# Patient Record
Sex: Female | Born: 1985 | Race: White | Hispanic: Yes | Marital: Single | Smoking: Never smoker
Health system: Southern US, Community
[De-identification: ages and names within clinical notes are randomized; demographics above are authoritative.]

## PROBLEM LIST (undated history)

## (undated) DIAGNOSIS — Z789 Other specified health status: Secondary | ICD-10-CM

## (undated) HISTORY — PX: NO PAST SURGERIES: SHX2092

---

## 2014-10-26 NOTE — L&D Delivery Note (Signed)
Delivery Note At 2:47 AM a viable female was delivered via Vaginal, Spontaneous Delivery (Presentation: ; Occiput Posterior).  APGAR: 7-8 , ; weight: 1800 grams .   Placenta status: Intact, Spontaneous.  Cord: 3 vessels with the following complications: None.  Cord pH: none  Anesthesia: None  Episiotomy: None Lacerations: None Suture Repair: none Est. Blood Loss (mL): 500  Mom to postpartum.  Baby to NICU for prematurity.  [name] A 09/08/2015, 3:26 AM

## 2015-04-08 ENCOUNTER — Telehealth: Payer: Self-pay | Admitting: Obstetrics

## 2015-04-08 NOTE — Telephone Encounter (Signed)
[id number] - scheduled patient's NOB appt for [phone] with Orvilla Cornwall. brm

## 2015-04-18 ENCOUNTER — Encounter: Payer: Medicaid Other | Admitting: Certified Nurse Midwife

## 2015-04-24 ENCOUNTER — Encounter: Payer: Self-pay | Admitting: Certified Nurse Midwife

## 2015-04-24 ENCOUNTER — Ambulatory Visit (INDEPENDENT_AMBULATORY_CARE_PROVIDER_SITE_OTHER): Payer: Medicaid Other | Admitting: Certified Nurse Midwife

## 2015-04-24 VITALS — BP 110/66 | HR 86 | Temp 98.3°F | Wt 133.0 lb

## 2015-04-24 DIAGNOSIS — Z3492 Encounter for supervision of normal pregnancy, unspecified, second trimester: Secondary | ICD-10-CM | POA: Insufficient documentation

## 2015-04-24 DIAGNOSIS — Z3482 Encounter for supervision of other normal pregnancy, second trimester: Secondary | ICD-10-CM | POA: Diagnosis not present

## 2015-04-24 DIAGNOSIS — Z36 Encounter for antenatal screening of mother: Secondary | ICD-10-CM

## 2015-04-24 DIAGNOSIS — Z3687 Encounter for antenatal screening for uncertain dates: Secondary | ICD-10-CM

## 2015-04-24 DIAGNOSIS — O219 Vomiting of pregnancy, unspecified: Secondary | ICD-10-CM

## 2015-04-24 DIAGNOSIS — O2690 Pregnancy related conditions, unspecified, unspecified trimester: Secondary | ICD-10-CM | POA: Diagnosis not present

## 2015-04-24 DIAGNOSIS — K219 Gastro-esophageal reflux disease without esophagitis: Secondary | ICD-10-CM

## 2015-04-24 LAB — POCT URINE PREGNANCY: Preg Test, Ur: POSITIVE — AB

## 2015-04-24 LAB — TSH: TSH: 0.995 u[IU]/mL (ref 0.350–4.500)

## 2015-04-24 MED ORDER — RANITIDINE HCL 150 MG PO TABS: 150.0000 mg | ORAL_TABLET | Freq: Two times a day (BID) | ORAL | Status: AC

## 2015-04-24 MED ORDER — ONDANSETRON HCL 4 MG PO TABS: 4.0000 mg | ORAL_TABLET | Freq: Three times a day (TID) | ORAL | Status: DC | PRN

## 2015-04-24 NOTE — Progress Notes (Signed)
Subjective:    Candice Reed is being seen today for her first obstetrical visit.  This is a planned pregnancy. She is at 7976w0d gestation. Her obstetrical history is significant for no risk factors. Relationship with FOB: significant other, living together. Patient does intend to breast feed. Pregnancy history fully reviewed.  Spanish interpreter present for exam.    The information documented in the HPI was reviewed and verified.  Menstrual History: OB History    Gravida Para Term Preterm AB TAB SAB Ectopic Multiple Living   2               Menarche age: 89  Patient's last menstrual period was 12/26/2014.    History reviewed. No pertinent past medical history.  History reviewed. No pertinent past surgical history.   (Not in a hospital admission) No Known Allergies  History  Substance Use Topics  . Smoking status: Not on file  . Smokeless tobacco: Not on file  . Alcohol Use: Not on file   Denies any alcohol, tobacco or illicit drug use.    Family History  Problem Relation Age of Onset  . Epilepsy Sister      Review of Systems Constitutional: negative for weight loss Gastrointestinal: + for nausea & vomiting Genitourinary:negative for genital lesions and vaginal discharge and dysuria Musculoskeletal:negative for back pain Behavioral/Psych: negative for abusive relationship, depression, illegal drug usage and tobacco use    Objective:    LMP 12/26/2014 General Appearance:    Alert, cooperative, no distress, appears stated age  Head:    Normocephalic, without obvious abnormality, atraumatic  Eyes:    PERRL, conjunctiva/corneas clear, EOM's intact, fundi    benign, both eyes  Ears:    Normal TM's and external ear canals, both ears  Nose:   Nares normal, septum midline, mucosa normal, no drainage    or sinus tenderness  Throat:   Lips, mucosa, and tongue normal; teeth and gums normal  Neck:   Supple, symmetrical, trachea midline, no adenopathy;    thyroid:  no  enlargement/tenderness/nodules; no carotid   bruit or JVD  Back:     Symmetric, no curvature, ROM normal, no CVA tenderness  Lungs:     Clear to auscultation bilaterally, respirations unlabored  Chest Wall:    No tenderness or deformity   Heart:    Regular rate and rhythm, S1 and S2 normal, no murmur, rub   or gallop  Breast Exam:    No tenderness, masses, or nipple abnormality  Abdomen:     Soft, non-tender, bowel sounds active all four quadrants,    no masses, no organomegaly  Genitalia:    Normal female without lesion, discharge or tenderness  Extremities:   Extremities normal, atraumatic, no cyanosis or edema  Pulses:   2+ and symmetric all extremities  Skin:   Skin color, texture, turgor normal, no rashes or lesions  Lymph nodes:   Cervical, supraclavicular, and axillary nodes normal  Neurologic:   CNII-XII intact, normal strength, sensation and reflexes    throughout     Cervix: long, thick, closed.      Fundus: about 13-[redacted] weeks pregnant.     Lab Review Urine pregnancy test Labs reviewed yes Radiologic studies reviewed no Assessment:    Pregnancy at 5376w0d weeks    Plan:      Prenatal vitamins.  Counseling provided regarding continued use of seat belts, cessation of alcohol consumption, smoking or use of illicit drugs; infection precautions i.e., influenza/TDAP immunizations, toxoplasmosis,CMV, parvovirus, listeria and varicella;  workplace safety, exercise during pregnancy; routine dental care, safe medications, sexual activity, hot tubs, saunas, pools, travel, caffeine use, fish and methlymercury, potential toxins, hair treatments, varicose veins Weight gain recommendations per IOM guidelines reviewed: underweight/BMI< 18.5--> gain 28 - 40 lbs; normal weight/BMI 18.5 - 24.9--> gain 25 - 35 lbs; overweight/BMI 25 - 29.9--> gain 15 - 25 lbs; obese/BMI >30->gain  11 - 20 lbs Problem list reviewed and updated. FIRST/CF mutation testing/NIPT/QUAD SCREEN/fragile X/Ashkenazi Jewish  population testing/Spinal muscular atrophy discussed: requested. Role of ultrasound in pregnancy discussed; fetal survey: requested. Amniocentesis discussed: not indicated. VBAC calculator score: VBAC consent form provided Meds ordered this encounter  Medications  . ranitidine (ZANTAC) 150 MG tablet    Sig: Take 1 tablet (150 mg total) by mouth 2 (two) times daily.    Dispense:  30 tablet    Refill:  3  . ondansetron (ZOFRAN) 4 MG tablet    Sig: Take 1 tablet (4 mg total) by mouth every 8 (eight) hours as needed for nausea or vomiting.    Dispense:  30 tablet    Refill:  4   Orders Placed This Encounter  Procedures  . Culture, OB Urine  . SureSwab, Vaginosis/Vaginitis Plus  . US OB Transvaginal    Standing Status: Future     Number of Occurrences:      Standing Expiration Date: 06/23/2016    Order Specific Question:  Reason for Exam (SYMPTOM  OR DIAGNOSIS REQUIRED)    Answer:  dating    Order Specific Question:  Preferred imaging location?    Answer:  [facility]  . Obstetric panel  . HIV antibody  . Hemoglobinopathy evaluation  . Varicella zoster antibody, IgG  . Vit D  25 hydroxy (rtn osteoporosis monitoring)  . TSH  . AFP, Quad Screen    Order Specific Question:  Repeat Sample    Answer:  No    Order Specific Question:  Maternal Race    Answer:  hispanic    Order Specific Question:  EDD    Answer:  10/02/2015    Order Specific Question:  Pregnancy Donor Egg (Y/N)    Answer:  No    Order Specific Question:  Gest Age at U/S (Wk.Dy)    Answer:  17.0    Order Specific Question:  Number of Fetuses    Answer:  1    Order Specific Question:  Hx of OSB/NTD?    Answer:  No    Order Specific Question:  Maternal IDDM (insulin-dependent diabetes mellitus)    Answer:  No  . POCT urine pregnancy  . POCT urinalysis dipstick    Follow up in 4 weeks. 50% of 30 min visit spent on counseling and coordination of care.

## 2015-04-25 LAB — CULTURE, OB URINE: Colony Count: 40000

## 2015-04-25 LAB — PAP IG W/ RFLX HPV ASCU

## 2015-04-25 LAB — VITAMIN D 25 HYDROXY (VIT D DEFICIENCY, FRACTURES): Vit D, 25-Hydroxy: 16 ng/mL — ABNORMAL LOW (ref 30–100)

## 2015-04-25 LAB — HIV ANTIBODY (ROUTINE TESTING W REFLEX): HIV 1&2 Ab, 4th Generation: NONREACTIVE

## 2015-04-25 LAB — VARICELLA ZOSTER ANTIBODY, IGG: Varicella IgG: 557.3 Index — ABNORMAL HIGH (ref <135.00)

## 2015-04-26 ENCOUNTER — Other Ambulatory Visit: Payer: Self-pay | Admitting: Certified Nurse Midwife

## 2015-04-26 DIAGNOSIS — O2342 Unspecified infection of urinary tract in pregnancy, second trimester: Secondary | ICD-10-CM

## 2015-04-26 LAB — OBSTETRIC PANEL
Antibody Screen: NEGATIVE
BASOS ABS: 0 10*3/uL (ref 0.0–0.1)
BASOS PCT: 0 % (ref 0–1)
EOS ABS: 0.1 10*3/uL (ref 0.0–0.7)
Eosinophils Relative: 1 % (ref 0–5)
HEMATOCRIT: 37.4 % (ref 36.0–46.0)
HEP B S AG: NEGATIVE
Hemoglobin: 12.6 g/dL (ref 12.0–15.0)
Lymphocytes Relative: 24 % (ref 12–46)
Lymphs Abs: 2.8 10*3/uL (ref 0.7–4.0)
MCH: 29.6 pg (ref 26.0–34.0)
MCHC: 33.7 g/dL (ref 30.0–36.0)
MCV: 87.8 fL (ref 78.0–100.0)
MONOS PCT: 7 % (ref 3–12)
MPV: 8.7 fL (ref 8.6–12.4)
Monocytes Absolute: 0.8 10*3/uL (ref 0.1–1.0)
NEUTROS ABS: 7.9 10*3/uL — AB (ref 1.7–7.7)
Neutrophils Relative %: 68 % (ref 43–77)
Platelets: 305 10*3/uL (ref 150–400)
RBC: 4.26 MIL/uL (ref 3.87–5.11)
RDW: 13.1 % (ref 11.5–15.5)
RH TYPE: POSITIVE
Rubella: 1.43 Index — ABNORMAL HIGH (ref <0.90)
WBC: 11.6 10*3/uL — AB (ref 4.0–10.5)

## 2015-04-26 LAB — HEMOGLOBINOPATHY EVALUATION
HEMOGLOBIN OTHER: 0 %
HGB F QUANT: 0 % (ref 0.0–2.0)
Hgb A2 Quant: 2.7 % (ref 2.2–3.2)
Hgb A: 97.3 % (ref 96.8–97.8)
Hgb S Quant: 0 %

## 2015-04-26 MED ORDER — NITROFURANTOIN MONOHYD MACRO 100 MG PO CAPS: 100.0000 mg | ORAL_CAPSULE | Freq: Two times a day (BID) | ORAL | Status: AC

## 2015-04-27 LAB — SURESWAB, VAGINOSIS/VAGINITIS PLUS
Atopobium vaginae: NOT DETECTED Log (cells/mL)
C. albicans, DNA: NOT DETECTED
C. glabrata, DNA: NOT DETECTED
C. parapsilosis, DNA: NOT DETECTED
C. trachomatis RNA, TMA: NOT DETECTED
C. tropicalis, DNA: NOT DETECTED
Gardnerella vaginalis: NOT DETECTED Log (cells/mL)
LACTOBACILLUS SPECIES: 6 Log (cells/mL)
MEGASPHAERA SPECIES: NOT DETECTED Log (cells/mL)
N. gonorrhoeae RNA, TMA: NOT DETECTED
T. vaginalis RNA, QL TMA: NOT DETECTED

## 2015-04-30 ENCOUNTER — Ambulatory Visit (HOSPITAL_COMMUNITY)
Admission: RE | Admit: 2015-04-30 | Discharge: 2015-04-30 | Disposition: A | Discharge status: Home / Self Care | Payer: Medicaid Other | Source: Ambulatory Visit | Attending: Certified Nurse Midwife | Admitting: Certified Nurse Midwife

## 2015-04-30 ENCOUNTER — Other Ambulatory Visit: Payer: Self-pay | Admitting: Certified Nurse Midwife

## 2015-04-30 DIAGNOSIS — Z36 Encounter for antenatal screening of mother: Secondary | ICD-10-CM | POA: Insufficient documentation

## 2015-04-30 DIAGNOSIS — Z3687 Encounter for antenatal screening for uncertain dates: Secondary | ICD-10-CM

## 2015-04-30 DIAGNOSIS — Z3A12 12 weeks gestation of pregnancy: Secondary | ICD-10-CM | POA: Insufficient documentation

## 2015-04-30 LAB — AFP, QUAD SCREEN
AFP: 7.5 ng/mL
Age Alone: 1:781 {titer}
CURR GEST AGE: 17 wks.days
Down Syndrome Scr Risk Est: 1:6 {titer}
HCG TOTAL: 135.84 [IU]/mL
INH: 315.2 pg/mL
Interpretation-AFP: POSITIVE — AB
MOM FOR AFP: 0.18
MoM for INH: 1.68
MoM for hCG: 3.97
OPEN SPINA BIFIDA: NEGATIVE
Osb Risk: <1:27300 {titer}
Tri 18 Scr Risk Est: NEGATIVE
Trisomy 18 (Edward) Syndrome Interp.: 1:590 {titer}
UE3 MOM: 0.1
uE3 Value: <0.1 ng/mL

## 2015-05-01 ENCOUNTER — Encounter: Payer: Medicaid Other | Admitting: Certified Nurse Midwife

## 2015-05-01 ENCOUNTER — Other Ambulatory Visit: Payer: Self-pay | Admitting: Certified Nurse Midwife

## 2015-05-01 DIAGNOSIS — Z0489 Encounter for examination and observation for other specified reasons: Secondary | ICD-10-CM

## 2015-05-01 DIAGNOSIS — IMO0002 Reserved for concepts with insufficient information to code with codable children: Secondary | ICD-10-CM

## 2015-05-01 DIAGNOSIS — O09891 Supervision of other high risk pregnancies, first trimester: Secondary | ICD-10-CM

## 2015-05-01 DIAGNOSIS — O280 Abnormal hematological finding on antenatal screening of mother: Secondary | ICD-10-CM

## 2015-05-02 ENCOUNTER — Encounter: Payer: Self-pay | Admitting: Certified Nurse Midwife

## 2015-05-02 ENCOUNTER — Ambulatory Visit (INDEPENDENT_AMBULATORY_CARE_PROVIDER_SITE_OTHER): Payer: Medicaid Other | Admitting: Certified Nurse Midwife

## 2015-05-02 VITALS — BP 107/74 | HR 80 | Temp 96.0°F | Wt 134.0 lb

## 2015-05-02 DIAGNOSIS — Z3482 Encounter for supervision of other normal pregnancy, second trimester: Secondary | ICD-10-CM

## 2015-05-02 LAB — POCT URINALYSIS DIPSTICK
BILIRUBIN UA: NEGATIVE
Blood, UA: NEGATIVE
GLUCOSE UA: NEGATIVE
Ketones, UA: NEGATIVE
LEUKOCYTES UA: NEGATIVE
Nitrite, UA: NEGATIVE
Protein, UA: NEGATIVE
Spec Grav, UA: <=1.005
Urobilinogen, UA: NEGATIVE
pH, UA: 7.5

## 2015-05-02 NOTE — Progress Notes (Signed)
  Subjective:    Candice Reed is a 29 y.o. female being seen today for her obstetrical visit. She is at 112 w3d gestation. Patient reports: no complaints and at end of visit expressed that she is having a lot of GERD symptoms and has been taking zantac.  Discussed elevated AFP/Quad screen for downs, will repeat testing at next ob visit d/t dating being in error when blood was drawn at last visit, was thinking by dating that she was 17 weeks, she however was about 11 weeks at time of testing.  Patient verbalized understanding.   Interpreter present for exam.    Problem List Items Addressed This Visit    None    Visit Diagnoses    Encounter for supervision of other normal pregnancy in second trimester    -  Primary    Relevant Orders    POCT urinalysis dipstick (Completed)      Patient Active Problem List   Diagnosis Date Noted  . Supervision of normal pregnancy in second trimester 04/24/2015    Objective:     BP 107/74 mmHg  Pulse 80  Temp(Src) 96 F (35.6 C)  Wt 134 lb (60.782 kg)  LMP 12/26/2014 Uterine Size: Below umbilicus   FHR: 150's  Assessment:    Pregnancy @ 2975w3d  weeks Doing well    Plan:    Problem list reviewed and updated. Labs reviewed.  Follow up in 4 weeks. FIRST/CF mutation testing/NIPT/QUAD SCREEN/fragile X/Ashkenazi Jewish population testing/Spinal muscular atrophy discussed: requested. Role of ultrasound in pregnancy discussed; fetal survey: requested. Amniocentesis discussed: not indicated. 50% of 15 minute visit spent on counseling and coordination of care.

## 2015-05-02 NOTE — Patient Instructions (Signed)
AFP Maternal This is a routine screen (tests) used to check for fetal abnormalities such as Down syndrome and neural tube defects. Down syndrome is a chromosomal abnormality, sometimes called Trisomy 16. Neural tube defects are serious birth defects. The brain, spinal cord, or their coverings do not develop completely. Women should be tested in the 15th to 20th week of pregnancy. The msAFP screen involves three or four tests that measure substances found in the blood that make the testing better. During development, AFP levels in fetal blood and amniotic fluid rise until about 12 weeks. The levels then gradually fall until birth. AFP is a protein produce by fetal tissue. AFP crosses the placenta and appears in the maternal blood. A baby with an open neural tube defect has an opening in its spine, head, or abdominal wall that allows higher than usual amounts of AFP to pass into the mother's blood. If a screen is positive, more tests are needed to make a diagnosis. These include ultrasound and perhaps amniocentesis (checking the fluid that surrounds the baby). These tests are used to help women and their caregivers make decisions about the management of their pregnancies. In pregnancies where the fetus is carrying the chromosomal defect that results in Down syndrome, the levels of AFP and unconjugated estriol tend to be low and hCG and inhibin A levels high.  PREPARATION FOR TEST Blood is drawn from a vein in your arm usually between the 15th and 20th weeks of pregnancy. Four different tests on your blood are done. These are AFP, hCG, unconjugated estriol, and inhibin A. The combination of tests produces a more accurate result. NORMAL FINDINGS   Adult: less than 40 ng/mL or less than 40 mg/L (SI units).  Child younger than 1 year: less than 30 ng/mL. Ranges are stratified by weeks of gestation and vary among laboratories. Ranges for normal findings may vary among different laboratories and hospitals. You  should always check with your doctor after having lab work or other tests done to discuss the meaning of your test results and whether your values are considered within normal limits. MEANING OF TEST  These are screening tests. Not all fetal abnormalities will give positive test results. Of all women who have positive AFP screening results, only a very small number of them have babies who actually have a neural tube defect or chromosomal abnormality. Your caregiver will go over the test results with you and discuss the importance and meaning of your results, as well as treatment options and the need for additional tests if necessary. OBTAINING THE TEST RESULTS It is your responsibility to obtain your test results. Ask the lab or department performing the test when and how you will get your results. Document Released: 11/03/2004 Document Revised: 02/26/2014 Document Reviewed: 09/15/2008 [facility] Patient Information 2015 [place], [place]. This information is not intended to replace advice given to you by your health care provider. Make sure you discuss any questions you have with your health care provider. Prenatal Care  WHAT IS PRENATAL CARE?  Prenatal care means health care during your pregnancy, before your baby is born. It is very important to take care of yourself and your baby during your pregnancy by:   Getting early prenatal care. If you know you are pregnant, or think you might be pregnant, call your health care provider as soon as possible. Schedule a visit for a prenatal exam.  Getting regular prenatal care. Follow your health care provider's schedule for blood and other necessary tests. Do not  miss appointments.  Doing everything you can to keep yourself and your baby healthy during your pregnancy.  Getting complete care. Prenatal care should include evaluation of the medical, dietary, educational, psychological, and social needs of you and your significant other. The medical and genetic  history of your family and the family of your baby's father should be discussed with your health care provider.  Discussing with your health care provider:  Prescription, over-the-counter, and herbal medicines that you take.  Any history of substance abuse, alcohol use, smoking, and illegal drug use.  Any history of domestic abuse and violence.  Immunizations you have received.  Your nutrition and diet.  The amount of exercise you do.  Any environmental and occupational hazards to which you are exposed.  History of sexually transmitted infections for both you and your partner.  Previous pregnancies you have had. WHY IS PRENATAL CARE SO IMPORTANT?  By regularly seeing your health care provider, you help ensure that problems can be identified early so that they can be treated as soon as possible. Other problems might be prevented. Many studies have shown that early and regular prenatal care is important for the health of mothers and their babies.  HOW CAN I TAKE CARE OF MYSELF WHILE I AM PREGNANT?  Here are ways to take care of yourself and your baby:   Start or continue taking your multivitamin with 400 micrograms (mcg) of folic acid every day.  Get early and regular prenatal care. It is very important to see a health care provider during your pregnancy. Your health care provider will check at each visit to make sure that you and your baby are healthy. If there are any problems, action can be taken right away to help you and your baby.  Eat a healthy diet that includes:  Fruits.  Vegetables.  Foods low in saturated fat.  Whole grains.  Calcium-rich foods, such as milk, yogurt, and hard cheeses.  Drink 6-8 glasses of liquids a day.  Unless your health care provider tells you not to, try to be physically active for 30 minutes, most days of the week. If you are pressed for time, you can get your activity in through 10-minute segments, three times a day.  Do not smoke, drink  alcohol, or use drugs. These can cause long-term damage to your baby. Talk with your health care provider about steps to take to stop smoking. Talk with a member of your faith community, a counselor, a trusted friend, or your health care provider if you are concerned about your alcohol or drug use.  Ask your health care provider before taking any medicine, even over-the-counter medicines. Some medicines are not safe to take during pregnancy.  Get plenty of rest and sleep.  Avoid hot tubs and saunas during pregnancy.  Do not have X-rays taken unless absolutely necessary and with the recommendation of your health care provider. A lead shield can be placed on your abdomen to protect your baby when X-rays are taken in other parts of your body.  Do not empty the cat litter when you are pregnant. It may contain a parasite that causes an infection called toxoplasmosis, which can cause birth defects. Also, use gloves when working in garden areas used by cats.  Do not eat uncooked or undercooked meats or fish.  Do not eat soft, mold-ripened cheeses (Brie, Camembert, and chevre) or soft, blue-veined cheese (Danish blue and Roquefort).  Stay away from toxic chemicals like:  Insecticides.  Solvents (some cleaners  or paint thinners).  Lead.  Mercury.  Sexual intercourse may continue until the end of the pregnancy, unless you have a medical problem or there is a problem with the pregnancy and your health care provider tells you not to.  Do not wear high-heel shoes, especially during the second half of the pregnancy. You can lose your balance and fall.  Do not take long trips, unless absolutely necessary. Be sure to see your health care provider before going on the trip.  Do not sit in one position for more than 2 hours when on a trip.  Take a copy of your medical records when going on a trip. Know where a hospital is located in the city you are visiting, in case of an emergency.  Most dangerous  household products will have pregnancy warnings on their labels. Ask your health care provider about products if you are unsure.  Limit or eliminate your caffeine intake from coffee, tea, sodas, medicines, and chocolate.  Many women continue working through pregnancy. Staying active might help you stay healthier. If you have a question about the safety or the hours you work at your particular job, talk with your health care provider.  Get informed:  Read books.  Watch videos.  Go to childbirth classes for you and your significant other.  Talk with experienced moms.  Ask your health care provider about childbirth education classes for you and your partner. Classes can help you and your partner prepare for the birth of your baby.  Ask about a baby doctor (pediatrician) and methods and pain medicine for labor, delivery, and possible cesarean delivery. HOW OFTEN SHOULD I SEE MY HEALTH CARE PROVIDER DURING PREGNANCY?  Your health care provider will give you a schedule for your prenatal visits. You will have visits more often as you get closer to the end of your pregnancy. An average pregnancy lasts about 40 weeks.  A typical schedule includes visiting your health care provider:   About once each month during your first 6 months of pregnancy.  Every 2 weeks during the next 2 months.  Weekly in the last month, until the delivery date. Your health care provider will probably want to see you more often if:  You are older than 35 years.  Your pregnancy is high risk because you have certain health problems or problems with the pregnancy, such as:  Diabetes.  High blood pressure.  The baby is not growing on schedule, according to the dates of the pregnancy. Your health care provider will do special tests to make sure you and your baby are not having any serious problems. WHAT HAPPENS DURING PRENATAL VISITS?   At your first prenatal visit, your health care provider will do a physical  exam and talk to you about your health history and the health history of your partner and your family. Your health care provider will be able to tell you what date to expect your baby to be born on.  Your first physical exam will include checks of your blood pressure, measurements of your height and weight, and an exam of your pelvic organs. Your health care provider will do a Pap test if you have not had one recently and will do cultures of your cervix to make sure there is no infection.  At each prenatal visit, there will be tests of your blood, urine, blood pressure, weight, and the progress of the baby will be checked.  At your later prenatal visits, your health care provider will  check how you are doing and how your baby is developing. You may have a number of tests done as your pregnancy progresses.  Ultrasound exams are often used to check on your baby's growth and health.  You may have more urine and blood tests, as well as special tests, if needed. These may include amniocentesis to examine fluid in the pregnancy sac, stress tests to check how the baby responds to contractions, or a biophysical profile to measure your baby's well-being. Your health care provider will explain the tests and why they are necessary.  You should be tested for high blood sugar (gestational diabetes) between the 24th and 28th weeks of your pregnancy.  You should discuss with your health care provider your plans to breastfeed or bottle-feed your baby.  Each visit is also a chance for you to learn about staying healthy during pregnancy and to ask questions. Document Released: 10/15/2003 Document Revised: 10/17/2013 Document Reviewed: 12/27/2013 [facility] Patient Information 2015 [place]. This information is not intended to replace advice given to you by your health care provider. Make sure you discuss any questions you have with your health care provider. Second Trimester of Pregnancy The second trimester  is from week 13 through week 28, months 4 through 6. The second trimester is often a time when you feel your best. Your body has also adjusted to being pregnant, and you begin to feel better physically. Usually, morning sickness has lessened or quit completely, you may have more energy, and you may have an increase in appetite. The second trimester is also a time when the fetus is growing rapidly. At the end of the sixth month, the fetus is about 9 inches long and weighs about 1 pounds. You will likely begin to feel the baby move (quickening) between 18 and 20 weeks of the pregnancy. BODY CHANGES Your body goes through many changes during pregnancy. The changes vary from woman to woman.   Your weight will continue to increase. You will notice your lower abdomen bulging out.  You may begin to get stretch marks on your hips, abdomen, and breasts.  You may develop headaches that can be relieved by medicines approved by your health care provider.  You may urinate more often because the fetus is pressing on your bladder.  You may develop or continue to have heartburn as a result of your pregnancy.  You may develop constipation because certain hormones are causing the muscles that push waste through your intestines to slow down.  You may develop hemorrhoids or swollen, bulging veins (varicose veins).  You may have back pain because of the weight gain and pregnancy hormones relaxing your joints between the bones in your pelvis and as a result of a shift in weight and the muscles that support your balance.  Your breasts will continue to grow and be tender.  Your gums may bleed and may be sensitive to brushing and flossing.  Dark spots or blotches (chloasma, mask of pregnancy) may develop on your face. This will likely fade after the baby is born.  A dark line from your belly button to the pubic area (linea nigra) may appear. This will likely fade after the baby is born.  You may have changes in  your hair. These can include thickening of your hair, rapid growth, and changes in texture. Some women also have hair loss during or after pregnancy, or hair that feels dry or thin. Your hair will most likely return to normal after your baby is  born. WHAT TO EXPECT AT YOUR PRENATAL VISITS During a routine prenatal visit:  You will be weighed to make sure you and the fetus are growing normally.  Your blood pressure will be taken.  Your abdomen will be measured to track your baby's growth.  The fetal heartbeat will be listened to.  Any test results from the previous visit will be discussed. Your health care provider may ask you:  How you are feeling.  If you are feeling the baby move.  If you have had any abnormal symptoms, such as leaking fluid, bleeding, severe headaches, or abdominal cramping.  If you have any questions. Other tests that may be performed during your second trimester include:  Blood tests that check for:  Low iron levels (anemia).  Gestational diabetes (between 24 and 28 weeks).  Rh antibodies.  Urine tests to check for infections, diabetes, or protein in the urine.  An ultrasound to confirm the proper growth and development of the baby.  An amniocentesis to check for possible genetic problems.  Fetal screens for spina bifida and Down syndrome. HOME CARE INSTRUCTIONS   Avoid all smoking, herbs, alcohol, and unprescribed drugs. These chemicals affect the formation and growth of the baby.  Follow your health care provider's instructions regarding medicine use. There are medicines that are either safe or unsafe to take during pregnancy.  Exercise only as directed by your health care provider. Experiencing uterine cramps is a good sign to stop exercising.  Continue to eat regular, healthy meals.  Wear a good support bra for breast tenderness.  Do not use hot tubs, steam rooms, or saunas.  Wear your seat belt at all times when driving.  Avoid raw  meat, uncooked cheese, cat litter boxes, and soil used by cats. These carry germs that can cause birth defects in the baby.  Take your prenatal vitamins.  Try taking a stool softener (if your health care provider approves) if you develop constipation. Eat more high-fiber foods, such as fresh vegetables or fruit and whole grains. Drink plenty of fluids to keep your urine clear or pale yellow.  Take warm sitz baths to soothe any pain or discomfort caused by hemorrhoids. Use hemorrhoid cream if your health care provider approves.  If you develop varicose veins, wear support hose. Elevate your feet for 15 minutes, 3-4 times a day. Limit salt in your diet.  Avoid heavy lifting, wear low heel shoes, and practice good posture.  Rest with your legs elevated if you have leg cramps or low back pain.  Visit your dentist if you have not gone yet during your pregnancy. Use a soft toothbrush to brush your teeth and be gentle when you floss.  A sexual relationship may be continued unless your health care provider directs you otherwise.  Continue to go to all your prenatal visits as directed by your health care provider. SEEK MEDICAL CARE IF:   You have dizziness.  You have mild pelvic cramps, pelvic pressure, or nagging pain in the abdominal area.  You have persistent nausea, vomiting, or diarrhea.  You have a bad smelling vaginal discharge.  You have pain with urination. SEEK IMMEDIATE MEDICAL CARE IF:   You have a fever.  You are leaking fluid from your vagina.  You have spotting or bleeding from your vagina.  You have severe abdominal cramping or pain.  You have rapid weight gain or loss.  You have shortness of breath with chest pain.  You notice sudden or extreme swelling of your  face, hands, ankles, feet, or legs.  You have not felt your baby move in over an hour.  You have severe headaches that do not go away with medicine.  You have vision changes. Document Released:  10/06/2001 Document Revised: 10/17/2013 Document Reviewed: 12/13/2012 [facility] Patient Information 2015 [place], [place]. This information is not intended to replace advice given to you by your health care provider. Make sure you discuss any questions you have with your health care provider.

## 2015-05-14 ENCOUNTER — Other Ambulatory Visit (HOSPITAL_COMMUNITY): Payer: Medicaid Other

## 2015-05-14 ENCOUNTER — Ambulatory Visit (HOSPITAL_COMMUNITY): Payer: Medicaid Other

## 2015-05-22 ENCOUNTER — Encounter: Payer: Medicaid Other | Admitting: Certified Nurse Midwife

## 2015-05-30 ENCOUNTER — Ambulatory Visit (INDEPENDENT_AMBULATORY_CARE_PROVIDER_SITE_OTHER): Payer: Medicaid Other | Admitting: Certified Nurse Midwife

## 2015-05-30 VITALS — BP 105/72 | HR 69 | Temp 97.9°F | Wt 139.4 lb

## 2015-05-30 DIAGNOSIS — Z3482 Encounter for supervision of other normal pregnancy, second trimester: Secondary | ICD-10-CM

## 2015-05-30 LAB — POCT URINALYSIS DIPSTICK
Bilirubin, UA: NEGATIVE
Blood, UA: NEGATIVE
Glucose, UA: NORMAL
Ketones, UA: NEGATIVE
Leukocytes, UA: NEGATIVE
Nitrite, UA: NEGATIVE
SPEC GRAV UA: 1.01
Urobilinogen, UA: NEGATIVE
pH, UA: 8

## 2015-05-30 NOTE — Progress Notes (Signed)
  Subjective:    Candice Reed is a 29 y.o. female being seen today for her obstetrical visit. She is at [redacted]w[redacted]d gestation. Patient reports: heartburn, no bleeding, no contractions, no cramping and no leaking.  Not feeling fetal movement yet.  Has been taking zantac for GERD stating it helps, declines omeprazole.    Problem List Items Addressed This Visit    None    Visit Diagnoses    Supervision of other normal pregnancy, antepartum, second trimester    -  Primary    Relevant Orders    AFP, Quad Screen    US OB Comp + 14 Wk    POCT urinalysis dipstick (Completed)      Patient Active Problem List   Diagnosis Date Noted  . Supervision of normal pregnancy in second trimester 04/24/2015    Objective:     BP 105/72 mmHg  Pulse 69  Temp(Src) 97.9 F (36.6 C)  Wt 139 lb 6.4 oz (63.231 kg)  LMP 12/26/2014 Uterine Size: Below umbilicus   FHR: 150's by doppler.   Assessment:    Pregnancy @ [redacted]w[redacted]d  weeks Doing well GERD    Plan:    Problem list reviewed and updated. Labs reviewed.  Follow up in 4 weeks. FIRST/CF mutation testing/NIPT/QUAD SCREEN/fragile X/Ashkenazi Jewish population testing/Spinal muscular atrophy discussed: ordered. Role of ultrasound in pregnancy discussed; fetal survey: ordered. Amniocentesis discussed: not indicated. 50% of 15 minute visit spent on counseling and coordination of care.

## 2015-06-03 LAB — AFP, QUAD SCREEN
AFP: 32.3 ng/mL
Curr Gest Age: 16.3 wks.days
HCG TOTAL: 47.2 [IU]/mL
INH: 179.2 pg/mL
Interpretation-AFP: NEGATIVE
MOM FOR HCG: 1.48
MoM for AFP: 0.94
MoM for INH: 1.25
OPEN SPINA BIFIDA: NEGATIVE
TRI 18 SCR RISK EST: NEGATIVE
UE3 MOM: 0.62
UE3 VALUE: 0.74 ng/mL

## 2015-06-25 ENCOUNTER — Ambulatory Visit (HOSPITAL_COMMUNITY)
Admission: RE | Admit: 2015-06-25 | Discharge: 2015-06-25 | Disposition: A | Discharge status: Home / Self Care | Payer: Medicaid Other | Source: Ambulatory Visit

## 2015-06-25 ENCOUNTER — Ambulatory Visit (HOSPITAL_COMMUNITY)
Admission: RE | Admit: 2015-06-25 | Discharge: 2015-06-25 | Disposition: A | Discharge status: Home / Self Care | Payer: Medicaid Other | Source: Ambulatory Visit | Attending: Obstetrics | Admitting: Obstetrics

## 2015-06-25 ENCOUNTER — Encounter: Payer: Medicaid Other | Admitting: Certified Nurse Midwife

## 2015-06-25 ENCOUNTER — Other Ambulatory Visit: Payer: Self-pay | Admitting: Certified Nurse Midwife

## 2015-06-25 DIAGNOSIS — Z36 Encounter for antenatal screening of mother: Secondary | ICD-10-CM | POA: Insufficient documentation

## 2015-06-25 DIAGNOSIS — O358XX0 Maternal care for other (suspected) fetal abnormality and damage, not applicable or unspecified: Secondary | ICD-10-CM

## 2015-06-25 DIAGNOSIS — Z3A20 20 weeks gestation of pregnancy: Secondary | ICD-10-CM | POA: Diagnosis not present

## 2015-06-25 DIAGNOSIS — IMO0002 Reserved for concepts with insufficient information to code with codable children: Secondary | ICD-10-CM

## 2015-06-25 DIAGNOSIS — Z3482 Encounter for supervision of other normal pregnancy, second trimester: Secondary | ICD-10-CM

## 2015-06-25 DIAGNOSIS — O35EXX0 Maternal care for other (suspected) fetal abnormality and damage, fetal genitourinary anomalies, not applicable or unspecified: Secondary | ICD-10-CM | POA: Insufficient documentation

## 2015-06-25 NOTE — Progress Notes (Signed)
Genetic Counseling  High-Risk Gestation Note  Appointment Date:  06/25/2015 Referred By: Shelly Bombard, MD Date of Birth:  Apr 27, 1986   Pregnancy History: G2P1001 Estimated Date of Delivery: 11/11/15 Estimated Gestational Age: 79w1dAttending: MRenella Cunas MD   I met with Candice Reed for genetic counseling because of abnormal ultrasound findings. [facility] telephonic interpreters #[phone] #[phone] and #[phone] Spanish/English medical interpretation during today's session. Multiple interpreters were used given difficulties with phone connections and being disconnected from interpreters. The patient's husband, JJacqulyn Bath was also on speaker phone for part of today's visit.   In Summary:   Ultrasound today visualized hydronephrosis  Quad screen previously performed within normal limits but increased the risk for fetal Down syndrome to 1 in 583  Patient elected to proceed with NIPS (Panorama) today and declined amniocentesis  She is interested in prenatal consultation with pediatric urology, which we will facilitate at later date  Follow-up ultrasound is scheduled for 07/23/15  We began by reviewing the ultrasound in detail. Ultrasound performed today visualized hydronephrosis. Complete ultrasound results reported separately.    We discussed that fetal hydronephrosis is defined as the dilatation of the fetal renal pelvis/pelvises.  This finding is very commonly observed by prenatal ultrasound and is estimated to occur in 2-3% of fetuses.  The female to female ratio is 2:1.  We discussed that hydronephrosis may be due to obstruction along the urinary tract, causing an excess collection of fluid in the renal pelvis.  The differential diagnoses can include UPJ obstruction, vesicoureteral reflux, UVJ obstruction, PUV, and multi or polycystic kidneys.  She was counseled that a definitive etiology may not be determined until after delivery.  She was counseled that  hydronephrosis is most often considered multifactorial in etiology, but dominant inheritance has been observed in some families.  We discussed that the finding of hydronephrosis/pyelectasis is associated with an increased risk for fetal aneuploidy.  This risk is highest when other anomalies or fetal differences are visualized.  Of note, the nuchal fold was visualized to be increased today, but given the advanced gestational age (236w1d this cannot be incorporated into risk assessment for fetal aneuploidy. The nuchal fold refers to the skin on the back of the fetal neck. A thick nuchal fold measurement is typically defined as greater than 5 mm at 15-[redacted] weeks gestation and greater than 6 mm at 18 through [redacted] weeks gestation.   Ms. Candice Picardireviously had Quad screen which was screen negative for Down syndrome, though the risk was increased from the patient's a priori risk (1 in 772 to 1 in 583). Quad screen was also screen negative for trisomy 18 and open neural tube defects. We reviewed chromosomes, nondisjunction and examples of chromosome conditions. We specifically discussed the associated increase in risk for fetal Down syndrome with the ultrasound findings and reviewed the variable features of Down syndrome. We reviewed the additional screening option of cell free DNA testing/noninvasive prenatal screening (NIPS). We reviewed the methodology, the conditions for which it screens, and the detection and false positive rates. We discussed that while this screen is highly sensitive and specific, it is not diagnostic. We also discussed the diagnostic option of amniocentesis including the benefits, risks, and limitations, including the approximate 1 in 30938-101isk for complications, such as spontaneous pregnancy loss.  After careful consideration, [name] elected to proceed with [facility] today [facility] and declined amniocentesis.   Ms. Candice Reed was also counseled  regarding prenatal and postnatal  management of hydronephrosis.  We discussed the options of serial ultrasounds (every 4 weeks) and prenatal consultation with a pediatric urologist.   We discussed that hydronephrosis may require neonatal surgical intervention. Follow-up ultrasound is scheduled for 07/23/15.   Family history information was not reviewed today given that the patient was not originally scheduled for genetic counseling and given time constraints. The patient reported no known relatives with birth defects or genetic conditions. Without further information regarding the provided family history, an accurate genetic risk cannot be calculated. Further genetic counseling is warranted if more information is obtained.  I counseled Candice Reed regarding the above risks and available options.  The approximate face-to-face time with the genetic counselor was 40 minutes.  Chipper Oman, MS Certified Genetic Counselor 06/25/2015

## 2015-06-26 ENCOUNTER — Ambulatory Visit (INDEPENDENT_AMBULATORY_CARE_PROVIDER_SITE_OTHER): Payer: Medicaid Other | Admitting: Certified Nurse Midwife

## 2015-06-26 ENCOUNTER — Encounter: Payer: Medicaid Other | Admitting: Certified Nurse Midwife

## 2015-06-26 VITALS — BP 113/80 | HR 72 | Temp 97.9°F | Ht 61.0 in | Wt 143.0 lb

## 2015-06-26 DIAGNOSIS — Z3482 Encounter for supervision of other normal pregnancy, second trimester: Secondary | ICD-10-CM

## 2015-06-26 LAB — POCT URINALYSIS DIPSTICK
Bilirubin, UA: NEGATIVE
Blood, UA: 250
GLUCOSE UA: NEGATIVE
Ketones, UA: NEGATIVE
LEUKOCYTES UA: NEGATIVE
Nitrite, UA: NEGATIVE
Spec Grav, UA: 1.01
UROBILINOGEN UA: NEGATIVE
pH, UA: 7

## 2015-06-26 NOTE — Progress Notes (Signed)
Subjective:    Candice Reed is a 29 y.o. female being seen today for her obstetrical visit. She is at [redacted]w[redacted]d gestation. Patient reports no complaints. Fetal movement: normal.  Has had dx of hydronephrosis on ultrasound.  NIPS pending.  Interpreter present for exam.    Menstrual History: OB History    Gravida Para Term Preterm AB TAB SAB Ectopic Multiple Living   Obstetric Comments   Breastfed daughter for over 2 years.         Patient's last menstrual period was 12/26/2014.    The following portions of the patient's history were reviewed and updated as appropriate: allergies, current medications, past family history, past medical history, past social history, past surgical history and problem list.  Review of Systems A comprehensive review of systems was negative.   Objective:    BP 113/80 mmHg  Pulse 72  Temp(Src) 97.9 F (36.6 C)  Ht  (1.549 m)  Wt 143 lb (64.864 kg)  BMI 27.03 kg/m2  LMP 12/26/2014 FHT: 150 BPM  Uterine Size: size equals dates     Assessment:    Pregnancy 20 and 2/7 weeks   fetal hydronephrosis on [place]  Plan:    OBGCT: discussed. Signs and symptoms of preterm labor: discussed. Follow up in 2 weeks.

## 2015-06-26 NOTE — Addendum Note (Signed)
Addended by: Marya Landry D on: 06/26/2015 11:08 AM   Modules accepted: Orders

## 2015-06-27 LAB — CULTURE, OB URINE
COLONY COUNT: NO GROWTH
ORGANISM ID, BACTERIA: NO GROWTH

## 2015-07-02 ENCOUNTER — Other Ambulatory Visit (HOSPITAL_COMMUNITY): Payer: Self-pay | Admitting: Certified Nurse Midwife

## 2015-07-02 ENCOUNTER — Telehealth (HOSPITAL_COMMUNITY): Payer: Self-pay | Admitting: MS"

## 2015-07-02 NOTE — Telephone Encounter (Signed)
Called Candice Reed to discuss her cell free fetal DNA test results via telephonic interpreter [phone] from [facility].  Mrs. Candice Reed had Panorama testing through [facility].  Testing was offered because of abnormal ultrasound findings.   The patient was identified by name and DOB.  We reviewed that these are within normal limits, showing a less than 1 in 10,000 risk for trisomies 21, 18 and 13, and monosomy X (Turner syndrome).  In addition, the risk for triploidy/vanishing twin and sex chromosome trisomies (47,XXX and 47,XXY) was also low risk.  [name] elected to have cffDNA analysis for 22q11 deletion syndrome, which was also low risk (<1 in 3330).  We reviewed that this testing identifies > 99% of pregnancies with trisomy 24, trisomy 89, sex chromosome trisomies (47,XXX and 47,XXY), and triploidy. The detection rate for trisomy 18 is 96%.  The detection rate for monosomy X is ~92%.  The false positive rate is <0.1% for all conditions. Testing was also consistent with female fetal sex.  She understands that this testing does not identify all genetic conditions.   The patient stated that she would like to have a prenatal pediatric urology appointment in order to help her be better prepared for what to expect about the baby's treatment after birth, but she also stated that she has faith that her baby will be born [place].  All questions were answered to her satisfaction, she was encouraged to call with additional questions or concerns.  Quinn Plowman, MS Certified Genetic Counselor 07/02/2015 2:47 PM

## 2015-07-10 ENCOUNTER — Ambulatory Visit (INDEPENDENT_AMBULATORY_CARE_PROVIDER_SITE_OTHER): Payer: Self-pay | Admitting: Certified Nurse Midwife

## 2015-07-10 VITALS — BP 104/71 | HR 95 | Temp 99.1°F | Wt 143.0 lb

## 2015-07-10 DIAGNOSIS — Z3482 Encounter for supervision of other normal pregnancy, second trimester: Secondary | ICD-10-CM

## 2015-07-10 NOTE — Progress Notes (Signed)
Subjective:    Candice Reed is a 29 y.o. female being seen today for her obstetrical visit. She is at [redacted]w[redacted]d gestation. Patient reports: no complaints . Fetal movement: normal.  Patient declined interpreter today for the visit.    Problem List Items Addressed This Visit    None     Patient Active Problem List   Diagnosis Date Noted  . Fetal hydronephrosis during pregnancy, antepartum 06/25/2015  . Supervision of normal pregnancy in second trimester 04/24/2015   Objective:    BP 104/71 mmHg  Pulse 95  Temp(Src) 99.1 F (37.3 C)  Wt 143 lb (64.864 kg)  LMP 12/26/2014 FHT: 145 BPM  Uterine Size: size equals dates     Assessment:    Pregnancy @ [redacted]w[redacted]d    Doing well  Plan:    OBGCT: ordered for next visit. Signs and symptoms of preterm labor: discussed.  Labs, problem list reviewed and updated 2 hr GTT planned next ROB visit.  Follow up in 4 weeks.

## 2015-07-23 ENCOUNTER — Other Ambulatory Visit (HOSPITAL_COMMUNITY): Payer: Self-pay | Admitting: Maternal and Fetal Medicine

## 2015-07-23 ENCOUNTER — Encounter (HOSPITAL_COMMUNITY): Payer: Self-pay

## 2015-07-23 ENCOUNTER — Ambulatory Visit (HOSPITAL_COMMUNITY)
Admission: RE | Admit: 2015-07-23 | Discharge: 2015-07-23 | Disposition: A | Discharge status: Home / Self Care | Payer: Medicaid Other | Source: Ambulatory Visit | Attending: Certified Nurse Midwife | Admitting: Certified Nurse Midwife

## 2015-07-23 DIAGNOSIS — O359XX0 Maternal care for (suspected) fetal abnormality and damage, unspecified, not applicable or unspecified: Secondary | ICD-10-CM

## 2015-07-23 DIAGNOSIS — Z3A24 24 weeks gestation of pregnancy: Secondary | ICD-10-CM | POA: Diagnosis not present

## 2015-07-23 DIAGNOSIS — O283 Abnormal ultrasonic finding on antenatal screening of mother: Secondary | ICD-10-CM

## 2015-07-23 DIAGNOSIS — IMO0002 Reserved for concepts with insufficient information to code with codable children: Secondary | ICD-10-CM

## 2015-07-23 MED ORDER — BETAMETHASONE SOD PHOS & ACET 6 (3-3) MG/ML IJ SUSP
12.0000 mg | Freq: Once | INTRAMUSCULAR | Status: AC
  Administered 2015-07-23: 12 mg via INTRAMUSCULAR
  Filled 2015-07-23: qty 2

## 2015-07-24 ENCOUNTER — Ambulatory Visit (HOSPITAL_COMMUNITY)
Admission: RE | Admit: 2015-07-24 | Discharge: 2015-07-24 | Disposition: A | Discharge status: Home / Self Care | Payer: Medicaid Other | Source: Ambulatory Visit | Attending: Obstetrics | Admitting: Obstetrics

## 2015-07-24 ENCOUNTER — Encounter: Payer: Medicaid Other | Admitting: Certified Nurse Midwife

## 2015-07-24 DIAGNOSIS — Z3A24 24 weeks gestation of pregnancy: Secondary | ICD-10-CM | POA: Diagnosis not present

## 2015-07-24 DIAGNOSIS — O358XX0 Maternal care for other (suspected) fetal abnormality and damage, not applicable or unspecified: Secondary | ICD-10-CM | POA: Insufficient documentation

## 2015-07-24 MED ORDER — BETAMETHASONE SOD PHOS & ACET 6 (3-3) MG/ML IJ SUSP
12.0000 mg | Freq: Once | INTRAMUSCULAR | Status: AC
  Administered 2015-07-24: 12 mg via INTRAMUSCULAR
  Filled 2015-07-24: qty 2

## 2015-07-25 ENCOUNTER — Other Ambulatory Visit: Payer: Self-pay | Admitting: Certified Nurse Midwife

## 2015-08-01 ENCOUNTER — Telehealth: Payer: Self-pay

## 2015-08-01 NOTE — Telephone Encounter (Signed)
[facility] about details for obtaining a Spanish-speaking counselor if needed. Spoke with Toniann Fail, who said [facility] is contracted with them and they can call to get Spanish  interpreter. She also said they would pay for 5 visits.

## 2015-08-07 ENCOUNTER — Ambulatory Visit (INDEPENDENT_AMBULATORY_CARE_PROVIDER_SITE_OTHER): Payer: Medicaid Other | Admitting: Certified Nurse Midwife

## 2015-08-07 ENCOUNTER — Ambulatory Visit (HOSPITAL_COMMUNITY)
Admission: RE | Admit: 2015-08-07 | Discharge: 2015-08-07 | Disposition: A | Discharge status: Home / Self Care | Payer: Medicaid Other | Source: Ambulatory Visit | Attending: Certified Nurse Midwife | Admitting: Certified Nurse Midwife

## 2015-08-07 ENCOUNTER — Other Ambulatory Visit: Payer: Medicaid Other

## 2015-08-07 VITALS — BP 116/74 | HR 98 | Temp 98.3°F | Wt 149.0 lb

## 2015-08-07 DIAGNOSIS — Z3482 Encounter for supervision of other normal pregnancy, second trimester: Secondary | ICD-10-CM

## 2015-08-07 DIAGNOSIS — O283 Abnormal ultrasonic finding on antenatal screening of mother: Secondary | ICD-10-CM | POA: Diagnosis not present

## 2015-08-07 LAB — POCT URINALYSIS DIPSTICK
Bilirubin, UA: NEGATIVE
Blood, UA: 250
Glucose, UA: 100
Ketones, UA: NEGATIVE
LEUKOCYTES UA: NEGATIVE
NITRITE UA: NEGATIVE
PH UA: 6
Spec Grav, UA: 1.02
Urobilinogen, UA: NEGATIVE

## 2015-08-08 NOTE — Progress Notes (Signed)
Subjective:    Candice Reed is a 29 y.o. female being seen today for her obstetrical visit. She is at 6872w3d gestation. Patient reports: no complaints . Fetal movement: normal.  Discussed last ultrasound.  Patient verbalized understanding and states that MFM had reviewed the results with her. Interpreter was present for the exam.  She states that she was apprehensive about her f/u ultrasound today d/t previous results.  Discussed counseling and encouraged patient to go ahead with counseling so that services are in place before delivery.  Patient agreed.    Problem List Items Addressed This Visit    None    Visit Diagnoses    Encounter for supervision of other normal pregnancy in second trimester    -  Primary    Relevant Orders    POCT urinalysis dipstick (Completed)      Patient Active Problem List   Diagnosis Date Noted  . Fetal hydronephrosis during pregnancy, antepartum 06/25/2015  . Supervision of normal pregnancy in second trimester 04/24/2015   Objective:    BP 116/74 mmHg  Pulse 98  Temp(Src) 98.3 F (36.8 C)  Wt 149 lb (67.586 kg)  LMP 12/26/2014 FHT: 145 BPM  Uterine Size: size equals dates     Assessment:    Pregnancy @ 8072w3d    Doing well.  At risk for Postpartum Depression Plan:   Candice Reed counseling referral done.    OBGCT: ordered for next visit. Signs and symptoms of preterm labor: discussed.  Labs, problem list reviewed and updated 2 hr GTT planned for next ROB Follow up in 2 weeks.

## 2015-08-13 ENCOUNTER — Telehealth: Payer: Self-pay

## 2015-08-13 NOTE — Telephone Encounter (Signed)
Patient did not come to her [facility] appt today, 10/18 at 11:30am - she stated she was sick - did not reschedule - interpreter did come

## 2015-08-21 ENCOUNTER — Ambulatory Visit (INDEPENDENT_AMBULATORY_CARE_PROVIDER_SITE_OTHER): Payer: Medicaid Other | Admitting: Certified Nurse Midwife

## 2015-08-21 ENCOUNTER — Other Ambulatory Visit: Payer: Medicaid Other

## 2015-08-21 VITALS — BP 108/78 | HR 82 | Temp 97.7°F | Wt 153.0 lb

## 2015-08-21 DIAGNOSIS — O0993 Supervision of high risk pregnancy, unspecified, third trimester: Secondary | ICD-10-CM

## 2015-08-21 LAB — POCT URINALYSIS DIPSTICK
Bilirubin, UA: NEGATIVE
Blood, UA: 50
GLUCOSE UA: NEGATIVE
Ketones, UA: NEGATIVE
Nitrite, UA: NEGATIVE
PROTEIN UA: NEGATIVE
UROBILINOGEN UA: NEGATIVE
pH, UA: 7

## 2015-08-21 LAB — CBC
HCT: 37.9 % (ref 36.0–46.0)
Hemoglobin: 12.4 g/dL (ref 12.0–15.0)
MCH: 29.3 pg (ref 26.0–34.0)
MCHC: 32.7 g/dL (ref 30.0–36.0)
MCV: 89.6 fL (ref 78.0–100.0)
MPV: 9.6 fL (ref 8.6–12.4)
PLATELETS: 297 10*3/uL (ref 150–400)
RBC: 4.23 MIL/uL (ref 3.87–5.11)
RDW: 13 % (ref 11.5–15.5)
WBC: 13.2 10*3/uL — ABNORMAL HIGH (ref 4.0–10.5)

## 2015-08-21 NOTE — Progress Notes (Signed)
Subjective:    Candice Reed is a 29 y.o. female being seen today for her obstetrical visit. She is at 3271w2d gestation. Patient reports no complaints. Fetal movement: normal.  Discussed last ultrasound at MFM.  Discussed the reason for the BMZ injections.  Patient is worried about the fetus.  Declines counseling at this time.  States she is praying to God that the baby will be all right.  States that the fetal echo was normal.  Here for exam with spanish interpreter.    Problem List Items Addressed This Visit    None    Visit Diagnoses    Supervision of high risk pregnancy, antepartum, third trimester    -  Primary    Relevant Orders    POCT urinalysis dipstick (Completed)    Glucose Tolerance, 2 Hours w/1 Hour    CBC    HIV antibody    RPR      Patient Active Problem List   Diagnosis Date Noted  . Fetal hydronephrosis during pregnancy, antepartum 06/25/2015  . Supervision of normal pregnancy in second trimester 04/24/2015   Objective:    BP 108/78 mmHg  Pulse 82  Temp(Src) 97.7 F (36.5 C)  Wt 153 lb (69.4 kg)  LMP 12/26/2014 FHT:  150 BPM  Uterine Size: size equals dates  Presentation: cephalic     Assessment:    Pregnancy @ 5071w2d weeks   Doing well.   Fetus: MAAC: hydronephrosis, hydroureters, anhydrominos.   Plan:   OGTT 2 hour today   BMZ given 9/27&9/28 to help with fetal lung development.    labs reviewed, problem list updated Consent signed. GBS planning TDAP offered  Rhogam given for RH negative Pediatrician: discussed. Infant feeding: plans to breastfeed. Maternity leave: N/A. Cigarette smoking: never smoked. Orders Placed This Encounter  Procedures  . Glucose Tolerance, 2 Hours w/1 Hour  . CBC  . HIV antibody  . RPR  . POCT urinalysis dipstick   No orders of the defined types were placed in this encounter.   Follow up in 2 Weeks with Dr. Clearance CootsHarper to meet him.

## 2015-08-22 LAB — GLUCOSE TOLERANCE, 2 HOURS W/ 1HR
GLUCOSE, FASTING: 80 mg/dL (ref 65–99)
GLUCOSE: 128 mg/dL (ref 70–170)
Glucose, 2 hour: 90 mg/dL (ref 70–139)

## 2015-08-22 LAB — HIV ANTIBODY (ROUTINE TESTING W REFLEX): HIV 1&2 Ab, 4th Generation: NONREACTIVE

## 2015-08-23 LAB — RPR

## 2015-08-28 ENCOUNTER — Encounter: Payer: Medicaid Other | Admitting: Certified Nurse Midwife

## 2015-08-29 ENCOUNTER — Ambulatory Visit (INDEPENDENT_AMBULATORY_CARE_PROVIDER_SITE_OTHER): Payer: Medicaid Other | Admitting: Obstetrics

## 2015-08-29 ENCOUNTER — Encounter: Payer: Self-pay | Admitting: Obstetrics

## 2015-08-29 VITALS — BP 112/77 | Temp 98.1°F | Wt 154.0 lb

## 2015-08-29 DIAGNOSIS — O0993 Supervision of high risk pregnancy, unspecified, third trimester: Secondary | ICD-10-CM

## 2015-08-29 DIAGNOSIS — R319 Hematuria, unspecified: Secondary | ICD-10-CM

## 2015-08-29 DIAGNOSIS — N1330 Unspecified hydronephrosis: Secondary | ICD-10-CM | POA: Diagnosis not present

## 2015-08-29 LAB — POCT URINALYSIS DIPSTICK
Bilirubin, UA: NEGATIVE
Ketones, UA: NEGATIVE
Leukocytes, UA: NEGATIVE
Nitrite, UA: NEGATIVE
Protein, UA: NEGATIVE
SPEC GRAV UA: 1.015
UROBILINOGEN UA: NEGATIVE
pH, UA: 7

## 2015-08-29 NOTE — Progress Notes (Signed)
Subjective:    Candice Reed is a 29 y.o. female being seen today for her obstetrical visit. She is at 7715w3d gestation. Patient reports no complaints. Fetal movement: normal.  Problem List Items Addressed This Visit    None    Visit Diagnoses    Hematuria    -  Primary    Relevant Orders    POCT Urinalysis Dipstick (Completed)      Patient Active Problem List   Diagnosis Date Noted  . Fetal hydronephrosis during pregnancy, antepartum 06/25/2015  . Supervision of normal pregnancy in second trimester 04/24/2015   Objective:    BP 112/77 mmHg  Temp(Src) 98.1 F (36.7 C)  Wt 154 lb (69.854 kg)  LMP 12/26/2014 FHT:  150 BPM  Uterine Size: size equals dates  Presentation: unsure     Assessment:    Pregnancy @ 2715w3d weeks   Plan:     labs reviewed, problem list updated Consent signed. GBS sent TDAP offered  Rhogam given for RH negative Pediatrician: discussed. Infant feeding: plans to breastfeed. Maternity leave: discussed. Cigarette smoking: never smoked. Orders Placed This Encounter  Procedures  . POCT Urinalysis Dipstick   No orders of the defined types were placed in this encounter.   Follow up in 2 Weeks.

## 2015-09-04 ENCOUNTER — Ambulatory Visit (HOSPITAL_COMMUNITY)
Admission: RE | Admit: 2015-09-04 | Discharge: 2015-09-04 | Disposition: A | Discharge status: Home / Self Care | Payer: Medicaid Other | Source: Ambulatory Visit | Attending: Certified Nurse Midwife | Admitting: Certified Nurse Midwife

## 2015-09-04 ENCOUNTER — Other Ambulatory Visit (HOSPITAL_COMMUNITY): Payer: Self-pay | Admitting: Obstetrics and Gynecology

## 2015-09-04 ENCOUNTER — Encounter: Payer: Medicaid Other | Admitting: Certified Nurse Midwife

## 2015-09-04 ENCOUNTER — Encounter (HOSPITAL_COMMUNITY): Payer: Self-pay

## 2015-09-04 DIAGNOSIS — Z3A30 30 weeks gestation of pregnancy: Secondary | ICD-10-CM | POA: Diagnosis not present

## 2015-09-04 DIAGNOSIS — O283 Abnormal ultrasonic finding on antenatal screening of mother: Secondary | ICD-10-CM

## 2015-09-04 DIAGNOSIS — O358XX0 Maternal care for other (suspected) fetal abnormality and damage, not applicable or unspecified: Secondary | ICD-10-CM | POA: Diagnosis not present

## 2015-09-04 DIAGNOSIS — O359XX0 Maternal care for (suspected) fetal abnormality and damage, unspecified, not applicable or unspecified: Secondary | ICD-10-CM

## 2015-09-04 DIAGNOSIS — O4103X0 Oligohydramnios, third trimester, not applicable or unspecified: Secondary | ICD-10-CM

## 2015-09-04 DIAGNOSIS — O4100X0 Oligohydramnios, unspecified trimester, not applicable or unspecified: Secondary | ICD-10-CM | POA: Insufficient documentation

## 2015-09-08 ENCOUNTER — Inpatient Hospital Stay (HOSPITAL_COMMUNITY)
Admission: AD | Admit: 2015-09-08 | Discharge: 2015-09-09 | DRG: 775 | Disposition: A | Discharge status: Home / Self Care | Payer: Medicaid Other | Source: Ambulatory Visit | Attending: Obstetrics | Admitting: Obstetrics

## 2015-09-08 ENCOUNTER — Encounter (HOSPITAL_COMMUNITY): Payer: Self-pay

## 2015-09-08 DIAGNOSIS — O35EXX0 Maternal care for other (suspected) fetal abnormality and damage, fetal genitourinary anomalies, not applicable or unspecified: Secondary | ICD-10-CM

## 2015-09-08 DIAGNOSIS — O418X90 Other specified disorders of amniotic fluid and membranes, unspecified trimester, not applicable or unspecified: Secondary | ICD-10-CM | POA: Diagnosis present

## 2015-09-08 DIAGNOSIS — O358XX0 Maternal care for other (suspected) fetal abnormality and damage, not applicable or unspecified: Secondary | ICD-10-CM

## 2015-09-08 DIAGNOSIS — Z820 Family history of epilepsy and other diseases of the nervous system: Secondary | ICD-10-CM

## 2015-09-08 DIAGNOSIS — Z3A30 30 weeks gestation of pregnancy: Secondary | ICD-10-CM | POA: Diagnosis not present

## 2015-09-08 HISTORY — DX: Other specified health status: Z78.9

## 2015-09-08 LAB — HIV ANTIBODY (ROUTINE TESTING W REFLEX): HIV SCREEN 4TH GENERATION: NONREACTIVE

## 2015-09-08 LAB — URINALYSIS, ROUTINE W REFLEX MICROSCOPIC
BILIRUBIN URINE: NEGATIVE
Glucose, UA: NEGATIVE mg/dL
KETONES UR: NEGATIVE mg/dL
LEUKOCYTES UA: NEGATIVE
NITRITE: NEGATIVE
PH: 6.5 (ref 5.0–8.0)
PROTEIN: NEGATIVE mg/dL
Specific Gravity, Urine: 1.015 (ref 1.005–1.030)
Urobilinogen, UA: 0.2 mg/dL (ref 0.0–1.0)

## 2015-09-08 LAB — URINE MICROSCOPIC-ADD ON

## 2015-09-08 LAB — CBC
HEMATOCRIT: 36.3 % (ref 36.0–46.0)
HEMOGLOBIN: 12.3 g/dL (ref 12.0–15.0)
MCH: 29.6 pg (ref 26.0–34.0)
MCHC: 33.9 g/dL (ref 30.0–36.0)
MCV: 87.5 fL (ref 78.0–100.0)
Platelets: 270 10*3/uL (ref 150–400)
RBC: 4.15 MIL/uL (ref 3.87–5.11)
RDW: 12.8 % (ref 11.5–15.5)
WBC: 20.3 10*3/uL — AB (ref 4.0–10.5)

## 2015-09-08 LAB — RPR: RPR Ser Ql: NONREACTIVE

## 2015-09-08 LAB — TYPE AND SCREEN
ABO/RH(D): O POS
ANTIBODY SCREEN: NEGATIVE

## 2015-09-08 LAB — ABO/RH: ABO/RH(D): O POS

## 2015-09-08 MED ORDER — SENNOSIDES-DOCUSATE SODIUM 8.6-50 MG PO TABS
2.0000 | ORAL_TABLET | ORAL | Status: DC
  Administered 2015-09-09: 2 via ORAL
  Filled 2015-09-08: qty 2

## 2015-09-08 MED ORDER — MAGNESIUM SULFATE 50 % IJ SOLN: 2.0000 g/h | Freq: Once | INTRAVENOUS | Status: DC

## 2015-09-08 MED ORDER — OXYCODONE-ACETAMINOPHEN 5-325 MG PO TABS: 2.0000 | ORAL_TABLET | ORAL | Status: DC | PRN

## 2015-09-08 MED ORDER — LACTATED RINGERS IV SOLN
INTRAVENOUS | Status: DC
  Administered 2015-09-08: 02:00:00 via INTRAVENOUS

## 2015-09-08 MED ORDER — BETAMETHASONE SOD PHOS & ACET 6 (3-3) MG/ML IJ SUSP: 12.0000 mg | Freq: Once | INTRAMUSCULAR | Status: DC

## 2015-09-08 MED ORDER — LANOLIN HYDROUS EX OINT: TOPICAL_OINTMENT | CUTANEOUS | Status: DC | PRN

## 2015-09-08 MED ORDER — BENZOCAINE-MENTHOL 20-0.5 % EX AERO: 1.0000 "application " | INHALATION_SPRAY | CUTANEOUS | Status: DC | PRN

## 2015-09-08 MED ORDER — INFLUENZA VAC SPLIT QUAD 0.5 ML IM SUSY
0.5000 mL | PREFILLED_SYRINGE | INTRAMUSCULAR | Status: AC
  Administered 2015-09-09: 0.5 mL via INTRAMUSCULAR
  Filled 2015-09-08: qty 0.5

## 2015-09-08 MED ORDER — LIDOCAINE HCL (PF) 1 % IJ SOLN
30.0000 mL | INTRAMUSCULAR | Status: DC | PRN
  Filled 2015-09-08: qty 30

## 2015-09-08 MED ORDER — ACETAMINOPHEN 325 MG PO TABS: 650.0000 mg | ORAL_TABLET | ORAL | Status: DC | PRN

## 2015-09-08 MED ORDER — SIMETHICONE 80 MG PO CHEW: 80.0000 mg | CHEWABLE_TABLET | ORAL | Status: DC | PRN

## 2015-09-08 MED ORDER — BETAMETHASONE SOD PHOS & ACET 6 (3-3) MG/ML IJ SUSP
12.0000 mg | INTRAMUSCULAR | Status: AC
  Administered 2015-09-08: 12 mg via INTRAMUSCULAR
  Filled 2015-09-08: qty 2

## 2015-09-08 MED ORDER — DIPHENHYDRAMINE HCL 25 MG PO CAPS: 25.0000 mg | ORAL_CAPSULE | Freq: Four times a day (QID) | ORAL | Status: DC | PRN

## 2015-09-08 MED ORDER — OXYCODONE-ACETAMINOPHEN 5-325 MG PO TABS: 1.0000 | ORAL_TABLET | ORAL | Status: DC | PRN

## 2015-09-08 MED ORDER — SODIUM CHLORIDE 0.9 % IV SOLN
2.0000 g | Freq: Once | INTRAVENOUS | Status: AC
  Administered 2015-09-08: 2 g via INTRAVENOUS
  Filled 2015-09-08: qty 2000

## 2015-09-08 MED ORDER — OXYTOCIN BOLUS FROM INFUSION
500.0000 mL | INTRAVENOUS | Status: DC
  Administered 2015-09-08: 500 mL via INTRAVENOUS

## 2015-09-08 MED ORDER — IBUPROFEN 600 MG PO TABS
600.0000 mg | ORAL_TABLET | Freq: Four times a day (QID) | ORAL | Status: DC
  Administered 2015-09-08 – 2015-09-09 (×5): 600 mg via ORAL
  Filled 2015-09-08 (×5): qty 1

## 2015-09-08 MED ORDER — ONDANSETRON HCL 4 MG/2ML IJ SOLN: 4.0000 mg | INTRAMUSCULAR | Status: DC | PRN

## 2015-09-08 MED ORDER — MAGNESIUM SULFATE BOLUS VIA INFUSION
4.0000 g | Freq: Once | INTRAVENOUS | Status: DC
  Filled 2015-09-08: qty 500

## 2015-09-08 MED ORDER — FLEET ENEMA 7-19 GM/118ML RE ENEM: 1.0000 | ENEMA | RECTAL | Status: DC | PRN

## 2015-09-08 MED ORDER — DIBUCAINE 1 % RE OINT: 1.0000 "application " | TOPICAL_OINTMENT | RECTAL | Status: DC | PRN

## 2015-09-08 MED ORDER — TETANUS-DIPHTH-ACELL PERTUSSIS 5-2.5-18.5 LF-MCG/0.5 IM SUSP: 0.5000 mL | Freq: Once | INTRAMUSCULAR | Status: DC

## 2015-09-08 MED ORDER — OXYTOCIN 40 UNITS IN LACTATED RINGERS INFUSION - SIMPLE MED
62.5000 mL/h | INTRAVENOUS | Status: DC
  Filled 2015-09-08: qty 1000

## 2015-09-08 MED ORDER — WITCH HAZEL-GLYCERIN EX PADS: 1.0000 "application " | MEDICATED_PAD | CUTANEOUS | Status: DC | PRN

## 2015-09-08 MED ORDER — HYDROXYZINE HCL 50 MG PO TABS: 50.0000 mg | ORAL_TABLET | Freq: Four times a day (QID) | ORAL | Status: DC | PRN

## 2015-09-08 MED ORDER — ONDANSETRON HCL 4 MG PO TABS: 4.0000 mg | ORAL_TABLET | ORAL | Status: DC | PRN

## 2015-09-08 MED ORDER — LACTATED RINGERS IV SOLN
2.0000 g/h | INTRAVENOUS | Status: DC
Start: 2015-09-08 — End: 2015-09-08
  Filled 2015-09-08: qty 80

## 2015-09-08 MED ORDER — LACTATED RINGERS IV SOLN
500.0000 mL | INTRAVENOUS | Status: DC | PRN
Start: 2015-09-08 — End: 2015-09-08

## 2015-09-08 MED ORDER — OXYTOCIN 40 UNITS IN LACTATED RINGERS INFUSION - SIMPLE MED: 62.5000 mL/h | INTRAVENOUS | Status: DC | PRN

## 2015-09-08 MED ORDER — ZOLPIDEM TARTRATE 5 MG PO TABS: 5.0000 mg | ORAL_TABLET | Freq: Every evening | ORAL | Status: DC | PRN

## 2015-09-08 MED ORDER — CITRIC ACID-SODIUM CITRATE 334-500 MG/5ML PO SOLN: 30.0000 mL | ORAL | Status: DC | PRN

## 2015-09-08 MED ORDER — ONDANSETRON HCL 4 MG/2ML IJ SOLN: 4.0000 mg | Freq: Four times a day (QID) | INTRAMUSCULAR | Status: DC | PRN

## 2015-09-08 MED ORDER — PRENATAL MULTIVITAMIN CH
1.0000 | ORAL_TABLET | Freq: Every day | ORAL | Status: DC
  Administered 2015-09-08 – 2015-09-09 (×2): 1 via ORAL
  Filled 2015-09-08 (×2): qty 1

## 2015-09-08 MED ORDER — MAGNESIUM SULFATE 4 GM/100ML IV SOLN: 4.0000 g | INTRAVENOUS | Status: DC

## 2015-09-08 NOTE — MAU Provider Note (Signed)
History  Chief Complaint:  Contractions and Vaginal Bleeding  Candice Reed is a 29 y.o. 32P1001 female at 4960w6d presenting w/ report of uc's since 1030 this am, but worsening this pm, also some bright red vaginal bleeding tonight, lots of pressure like baby is going to come out.   Reports somewhat decreased fm today fetal movement, contractions: regular, every 2 minutes, vaginal bleeding: less flow than a normal period, membranes: intact. Denies uti s/s, abnormal/malodorous vag d/c or vulvovaginal itching/irritation.   [facility]  Pregnancy complicated by anyhydramnios dx at 24wks thought to be r/t possible nonfunctioning kidney/kidneys, bright echogenic kidneys, absence of fluid filled stomach, fetal hydronephrosis, hydroureters, normal Panorama. Received bmz 9/27 & 9/28.  H/O term uncomplicated svb  Obstetrical History: OB History    Gravida Para Term Preterm AB TAB SAB Ectopic Multiple Living   2 1 1       1       Obstetric Comments   Breastfed daughter for over 2 years.        Past Medical History: Past Medical History  Diagnosis Date  . Medical history non-contributory     Past Surgical History: Past Surgical History  Procedure Laterality Date  . No past surgeries      Social History: Social History   Social History  . Marital Status: Single    Spouse Name: N/A  . Number of Children: N/A  . Years of Education: N/A   Social History Main Topics  . Smoking status: Never Smoker   . Smokeless tobacco: None  . Alcohol Use: No  . Drug Use: No  . Sexual Activity: Not Asked   Other Topics Concern  . None   Social History Narrative    Allergies: No Known Allergies  Prescriptions prior to admission  Medication Sig Dispense Refill Last Dose  . Prenatal Multivit-Min-Fe-FA (PRENATAL VITAMINS PO) Take by mouth.   09/07/2015 at Unknown time  . ranitidine (ZANTAC) 150 MG tablet Take 1 tablet (150 mg total) by mouth 2 (two) times daily. 30 tablet 3  09/07/2015 at Unknown time  . ondansetron (ZOFRAN) 4 MG tablet Take 1 tablet (4 mg total) by mouth every 8 (eight) hours as needed for nausea or vomiting. (Patient not taking: Reported on 08/21/2015) 30 tablet 4 Not Taking    Review of Systems  Pertinent pos/neg as indicated in HPI  Physical Exam  Blood pressure 120/72, pulse 85, height 5\' 1"  (1.549 m), weight 71.578 kg (157 lb 12.8 oz), last menstrual period 12/26/2014. General appearance: alert and mild distress Lungs: clear to auscultation bilaterally, normal effort Heart: regular rate and rhythm Abdomen: gravid, soft, non-tender  SVE: ant lip/+2, vtx  Fetal monitoring: FHR: 165 bpm, variability: moderate,  Accelerations: Present,  decelerations:  Present mild variables w/ uc's Uterine activity: q 2-283mins  MAU Course  EFM SVE  Labs:  Results for orders placed or performed during the hospital encounter of 09/08/15 (from the past 24 hour(s))  Urinalysis, Routine w reflex microscopic (not at [facility])     Status: Abnormal   Collection Time: 09/08/15  1:40 AM  Result Value Ref Range   Color, Urine YELLOW YELLOW   APPearance CLEAR CLEAR   Specific Gravity, Urine 1.015 1.005 - 1.030   pH 6.5 5.0 - 8.0   Glucose, UA NEGATIVE NEGATIVE mg/dL   Hgb urine dipstick LARGE (A) NEGATIVE   Bilirubin Urine NEGATIVE NEGATIVE   Ketones, ur NEGATIVE NEGATIVE mg/dL   Protein, ur NEGATIVE NEGATIVE mg/dL  Urobilinogen, UA 0.2 0.0 - 1.0 mg/dL   Nitrite NEGATIVE NEGATIVE   Leukocytes, UA NEGATIVE NEGATIVE  Urine microscopic-add on     Status: None   Collection Time: 09/08/15  1:40 AM  Result Value Ref Range   Squamous Epithelial / LPF RARE RARE   WBC, UA 0-2 <3 WBC/hpf   RBC / HPF 21-50 <3 RBC/hpf   Bacteria, UA RARE RARE    Imaging:  n/a  Assessment and Plan  A:  [redacted]w[redacted]d SIUP  G2P1001  Active preterm labor  Anyhydramnios dx @ 24wks thought to be r/t possible non-functioning kidney/s  Fetal hydronephrosis, hydroureters, bright  echogenic kidneys w/ absence of fluid filled bladder  Cat 2 FHR P:  Admit to BS  BMZ rescue dose x 1 (last course on 9/27 & 9/28)  Mag 4gm load, 2gm/hr for CP prophylaxis  Ampicillin for GBS unknown/advanced PTL  Called Dr. Clearance Coots to notify to come  NICU notified by nursing staff    Marge Duncans CNM,WHNP-BC 11/13/20162:16 AM

## 2015-09-08 NOTE — Progress Notes (Signed)
Post Partum Day 0 Subjective: no complaints  Objective: Blood pressure 96/56, pulse 102, temperature 98.2 F (36.8 C), temperature source Oral, resp. rate 20, height 5\' 1"  (1.549 m), weight 157 lb (71.215 kg), last menstrual period 12/26/2014, SpO2 98 %.  Physical Exam:  General: alert and no distress Lochia: appropriate Uterine Fundus: firm Incision: none DVT Evaluation: No evidence of DVT seen on physical exam.   Recent Labs  09/08/15 0220  HGB 12.3  HCT 36.3    Assessment/Plan: Plan for discharge tomorrow   LOS: 0 days   Mohmed Farver A 09/08/2015, 8:54 AM

## 2015-09-08 NOTE — Consult Note (Signed)
Neonatology Note:   Attendance at Delivery:   I was asked by Dr. Harper to attend this NSVD at 30 6/7 weeks following SROM and onset of labor. The mother is a G2P1 O pos, GBS unknown with known anhydramnios. Fetal ultrasound has shown hydronephrosis, hydroureter, and anhydramnios throughout the pregnancy and she was followed by MFM. She presented to MAU tonight, 9 cm dilated with a bulging amniotic sac.ROM just before delivery, fluid clear. Infant vigorous with some spontaneous cry and tone. We bulb suctioned, then noted that he had less respiratory effort and his HR was about 90-100, so I applied the neopuff, giving some PPV breaths. His color improved, but was still somewhat dusky, so I intubated him at about 5 minutes with a 3.0 mm ETT to a depth of 9 cm at the lips. Equal breath sounds could be heard and the CO2 detector turned yellow immediately. We secured the ETT, noting continued equal breath sounds. We gave 4.5 ml surfactant via the ETT, dripping it in slowly, between 10-12 minutes of life, which he tolerated well. O2 saturations on 100% FIO2 were in the mid 70s. I spoke with the parents in the DR via Spanish interpreter, then we transported the baby to the NICU. Ap 4/7/8.   Nathaniel Wakeley C. Camaria Gerald, MD 

## 2015-09-08 NOTE — Progress Notes (Signed)
Assisted RN and MD with interpretation of patient update and procedures to take place in next hour. Interpreter was present during procedures.  Spanish interpreter

## 2015-09-08 NOTE — MAU Note (Signed)
Contractions started 2 hours ago.  Just had small amt of dr red bleeding on panty liner.  No leaking.

## 2015-09-08 NOTE — Progress Notes (Signed)
Met with both parents to provide emotional support.  The couple's daughter was also present.    Informed that mother was very emotional and withdrawn earlier.  Father states that they have spoken with the NICU team using a translator and understand that everything is being done for their child, and the next 24 hours is critical.  Parents state that they have strong belief in God and knows that the faith of their child is in his hand.  They had not selected a name for newborn, and seemed comfortable changing the conversation to discuss baby names.   They decided on [place].   Parents state that they are comfortable with care that newborn in receiving in NICU.  Validated their feelings throughout the conversation and provided supportive feedback.  Informed them of social work Fish farm manager.

## 2015-09-08 NOTE — H&P (Signed)
Candice Reed is a 29 y.o. female presenting for UC's. Maternal Medical History:  Reason for admission: Contractions.   Prenatal complications: no prenatal complications Prenatal Complications - Diabetes: none.    OB History    Gravida Para Term Preterm AB TAB SAB Ectopic Multiple Living   2 1 1       1       Obstetric Comments   Breastfed daughter for over 2 years.       Past Medical History  Diagnosis Date  . Medical history non-contributory    Past Surgical History  Procedure Laterality Date  . No past surgeries     Family History: family history includes Epilepsy in her sister. Social History:  reports that she has never smoked. She does not have any smokeless tobacco history on file. She reports that she does not drink alcohol or use illicit drugs.   Prenatal Transfer Tool  Maternal Diabetes: No Genetic Screening: Normal Maternal Ultrasounds/Referrals: Normal Fetal Ultrasounds or other Referrals:  [facility] .  Anhydramnios Maternal Substance Abuse:  No Significant Maternal Medications:  None Significant Maternal Lab Results:  None Other Comments:  None  Review of Systems  All other systems reviewed and are negative.     Blood pressure 122/75, pulse 87, temperature 98.7 F (37.1 C), temperature source Oral, resp. rate 18, height 5\' 1"  (1.549 m), weight 157 lb (71.215 kg), last menstrual period 12/26/2014. Maternal Exam:  Uterine Assessment: Contraction strength is moderate.  Abdomen: Patient reports no abdominal tenderness. Fetal presentation: vertex  Introitus: Normal vulva. Normal vagina.    Physical Exam  Nursing note and vitals reviewed. Constitutional: She is oriented to person, place, and time. She appears well-developed and well-nourished.  HENT:  Head: Normocephalic and atraumatic.  Eyes: Conjunctivae are normal. Pupils are equal, round, and reactive to light.  Neck: Normal range of motion. Neck supple.   Cardiovascular: Normal rate and regular rhythm.   Respiratory: Effort normal and breath sounds normal.  GI: Soft.  Genitourinary: Vagina normal and uterus normal.  Musculoskeletal: Normal range of motion.  Neurological: She is alert and oriented to person, place, and time.  Skin: Skin is warm and dry.  Psychiatric: She has a normal mood and affect. Her behavior is normal. Judgment and thought content normal.    Prenatal labs: ABO, Rh: O/POS/-- (06/29 1559) Antibody: NEG (06/29 1559) Rubella: 1.43 (06/29 1559) RPR: NON REAC (10/26 1142)  HBsAg: NEGATIVE (06/29 1559)  HIV: NONREACTIVE (10/26 1142)  GBS:     Assessment/Plan: 30 weeks.  Active labor.  H/O anhydramnios.  Admit.   [place]ak Rollyson A 09/08/2015, 2:25 AM

## 2015-09-08 NOTE — Progress Notes (Signed)
Assisted RN and NP with interpretation of baby patient update with parents.  Spanish interpreter

## 2015-09-08 NOTE — Progress Notes (Signed)
Checked on patient.  Also ordered patient late snack and breakfast.  Spanish Interpreter

## 2015-09-09 LAB — CBC
HEMATOCRIT: 32.8 % — AB (ref 36.0–46.0)
Hemoglobin: 10.9 g/dL — ABNORMAL LOW (ref 12.0–15.0)
MCH: 29.5 pg (ref 26.0–34.0)
MCHC: 33.2 g/dL (ref 30.0–36.0)
MCV: 88.9 fL (ref 78.0–100.0)
PLATELETS: 273 10*3/uL (ref 150–400)
RBC: 3.69 MIL/uL — ABNORMAL LOW (ref 3.87–5.11)
RDW: 13 % (ref 11.5–15.5)
WBC: 24.1 10*3/uL — AB (ref 4.0–10.5)

## 2015-09-09 MED ORDER — IBUPROFEN 600 MG PO TABS: 600.0000 mg | ORAL_TABLET | Freq: Four times a day (QID) | ORAL | Status: AC | PRN

## 2015-09-09 NOTE — Progress Notes (Signed)
Post Partum Day 1 Subjective: no complaints  Objective: Blood pressure 113/66, pulse 90, temperature 98.1 F (36.7 C), temperature source Oral, resp. rate 18, height 5\' 1"  (1.549 m), weight 157 lb (71.215 kg), last menstrual period 12/26/2014, SpO2 99 %, unknown if currently breastfeeding.  Physical Exam:  General: alert and no distress Lochia: appropriate Uterine Fundus: firm Incision: none DVT Evaluation: No evidence of DVT seen on physical exam.   Recent Labs  09/08/15 0220 09/09/15 0515  HGB 12.3 10.9*  HCT 36.3 32.8*    Assessment/Plan: Plan for discharge tomorrow   LOS: 1 day   Amira Podolak A 09/09/2015, 9:00 AM

## 2015-09-09 NOTE — Lactation Note (Signed)
This note was copied from the chart of Candice Reed. Lactation Consultation Note Mom has DEBP set up by RN. Has pumped. Baby isn't doing well, mom at bedside in NICU w/interpreter and DR. Mom tearful, baby isn't doing well, condition deteriating. Patient Name: Candice Benay SpiceYaneth Saenz Reed [id number]'s Date: 09/09/2015     Maternal Data    Feeding    LATCH Score/Interventions                      Lactation Tools Discussed/Used     Consult Status      Charyl DancerCARVER, Cuca Benassi G 09/09/2015, 3:47 AM

## 2015-09-09 NOTE — Progress Notes (Signed)
Patient discharged home with husband... Discharge instructions reviewed with patient while Spanish Interpreter, Eda, present at bedside... Patient and husband verbalized understanding... Chaplain gave husband resources to let the hospital know choice of funeral home once decided for infant... Comfort box given to family by NICU RN... Condition stable... No equipment... Ambulated to car with Dolphus Jenny. Riley, NT.

## 2015-09-09 NOTE — Progress Notes (Signed)
Eda, Spanish Interpreter, present at bedside for patient's discharge education. All questions answered.

## 2015-09-09 NOTE — Progress Notes (Signed)
CSW has researched funeral services in [place] today and was prepared to present the family with the information, but patient has already been discharged.  CSW called [facility] and left message requesting a call back at his convenience regarding funeral arrangements.

## 2015-09-09 NOTE — Discharge Summary (Signed)
Obstetric Discharge Summary Reason for Admission: onset of labor Prenatal Procedures: ultrasound Intrapartum Procedures: spontaneous vaginal delivery Postpartum Procedures: none Complications-Operative and Postpartum: none HEMOGLOBIN  Date Value Ref Range Status  09/09/2015 10.9* 12.0 - 15.0 g/dL Final   HCT  Date Value Ref Range Status  09/09/2015 32.8* 36.0 - 46.0 % Final    Physical Exam:  General: alert and no distress Lochia: appropriate Uterine Fundus: firm Incision: none DVT Evaluation: No evidence of DVT seen on physical exam.  Discharge Diagnoses: Preterm Delivery at 30 weeks                                         Anhydramnios  Discharge Information: Date: 09/09/2015 Activity: pelvic rest Diet: routine Medications: PNV, Ibuprofen and Colace Condition: stable Instructions: refer to practice specific booklet Discharge to: home Follow-up Information    Follow up with Roe Coombsachelle A Denney, CNM In 2 weeks.   Specialty:  Certified Nurse Midwife   Contact information:   [address] [place] [id number] [phone]       Schedule an appointment as soon as possible for a visit to follow up.      Newborn Data: Live born female  Birth Weight: 3 lb 15.5 oz (1800 g) APGAR: 4, 7  Baby in NICU for prematurity.  Candice Reed A 09/09/2015, 11:40 AM

## 2015-09-10 ENCOUNTER — Other Ambulatory Visit: Payer: Self-pay | Admitting: Obstetrics

## 2015-09-10 DIAGNOSIS — O283 Abnormal ultrasonic finding on antenatal screening of mother: Secondary | ICD-10-CM

## 2015-09-11 ENCOUNTER — Ambulatory Visit (HOSPITAL_COMMUNITY): Payer: Medicaid Other | Attending: Certified Nurse Midwife

## 2015-09-11 ENCOUNTER — Encounter: Payer: Medicaid Other | Admitting: Certified Nurse Midwife

## 2015-09-18 ENCOUNTER — Ambulatory Visit (HOSPITAL_COMMUNITY): Admission: RE | Admit: 2015-09-18 | Payer: Medicaid Other | Source: Ambulatory Visit

## 2015-09-18 ENCOUNTER — Encounter: Payer: Medicaid Other | Admitting: Certified Nurse Midwife

## 2015-09-25 ENCOUNTER — Encounter: Payer: Medicaid Other | Admitting: Certified Nurse Midwife

## 2015-09-25 ENCOUNTER — Ambulatory Visit (HOSPITAL_COMMUNITY): Payer: Medicaid Other

## 2015-10-02 ENCOUNTER — Ambulatory Visit (HOSPITAL_COMMUNITY): Payer: Medicaid Other

## 2015-10-02 ENCOUNTER — Encounter: Payer: Medicaid Other | Admitting: Certified Nurse Midwife

## 2015-10-09 ENCOUNTER — Encounter: Payer: Medicaid Other | Admitting: Certified Nurse Midwife

## 2015-10-09 ENCOUNTER — Ambulatory Visit (HOSPITAL_COMMUNITY): Payer: Medicaid Other

## 2015-10-16 ENCOUNTER — Encounter: Payer: Medicaid Other | Admitting: Certified Nurse Midwife

## 2015-10-30 ENCOUNTER — Encounter: Payer: Medicaid Other | Admitting: Certified Nurse Midwife

## 2015-11-05 ENCOUNTER — Telehealth: Payer: Self-pay | Admitting: *Deleted

## 2015-11-05 NOTE — Telephone Encounter (Signed)
Andrey CampanileSandy- [place]regnancy coordinator in [place] called to check on history of our patient. She has spoken to her on the phone and she seems to do well since the loss of her infant. She has a strong faith and family support system. She has not followed up for her post partum visit in the office and I discussed with Andrey CampanileSandy that it may be difficult for her to return to the office without her baby. Andrey CampanileSandy will call her next week to see if she has schedule her appointment as she told her she would.

## 2015-11-06 ENCOUNTER — Encounter: Payer: Medicaid Other | Admitting: Certified Nurse Midwife

## 2015-11-13 ENCOUNTER — Encounter: Payer: Medicaid Other | Admitting: Certified Nurse Midwife

## 2017-09-05 IMAGING — US US MFM OB FOLLOW-UP
1 series · 12 of 28 positions shown · non-contrast
Comparison: none

[Series 1: us mfm ob follow-up · 0.23mm/px · 49 acquisitions, 12 frames shown]
[im 2/49]
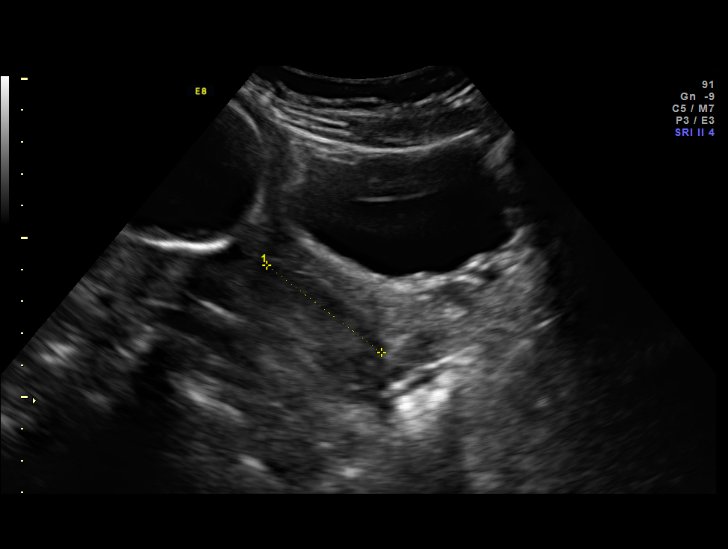
[im 6/49]
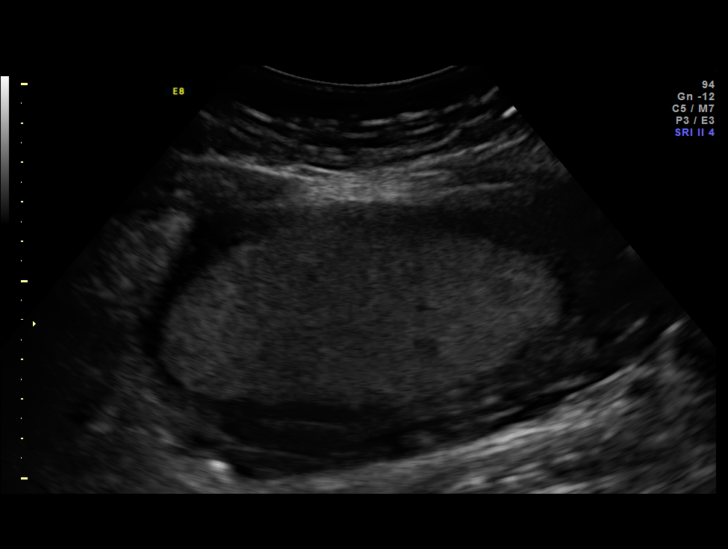
[im 9/49]
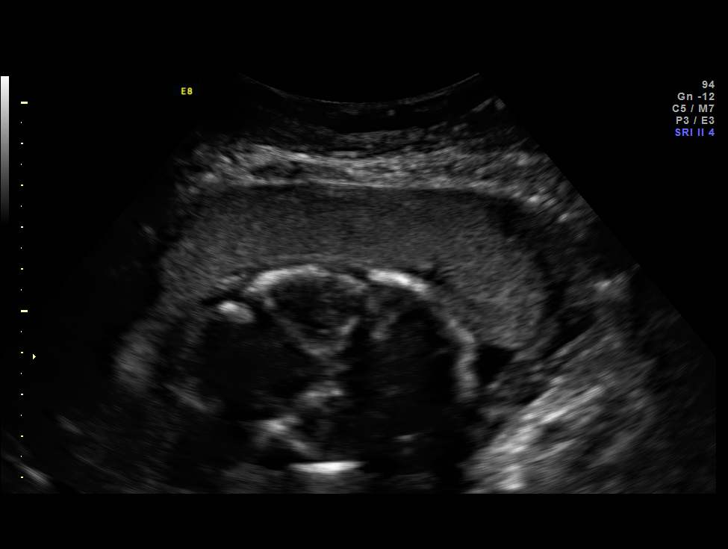
[im 15/49]
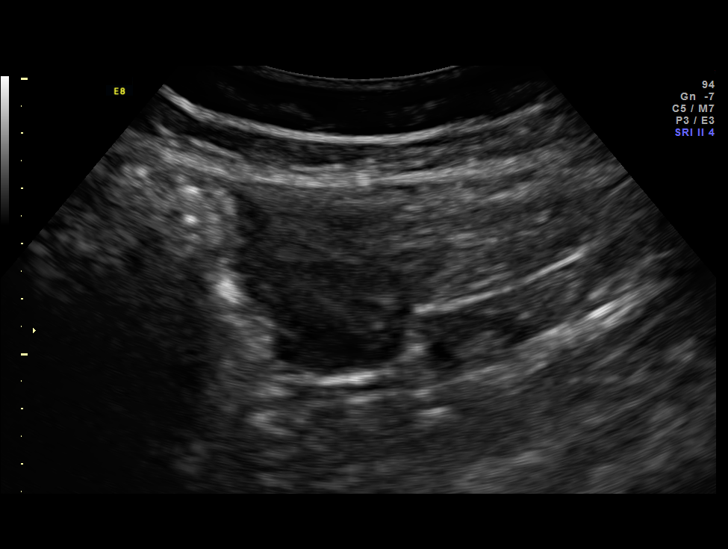
[im 18/49]
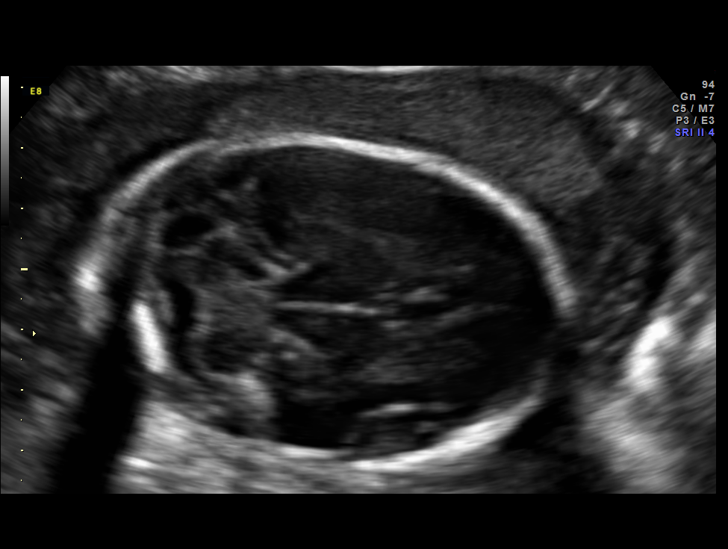
[im 22/49]
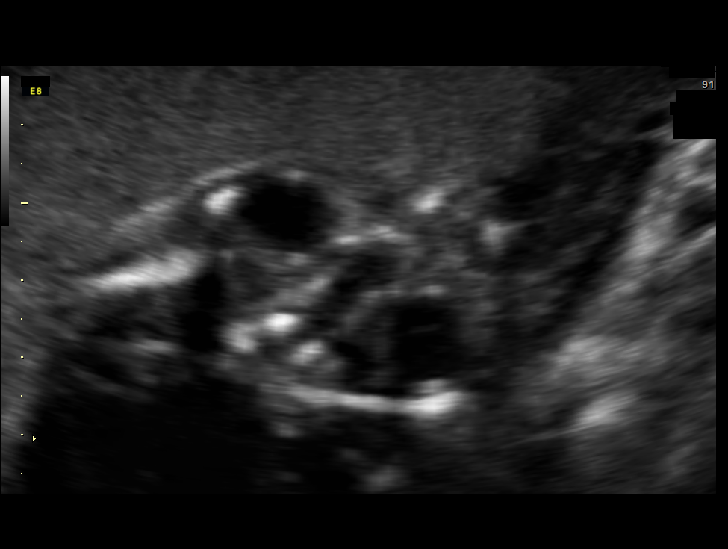
[im 27/49]
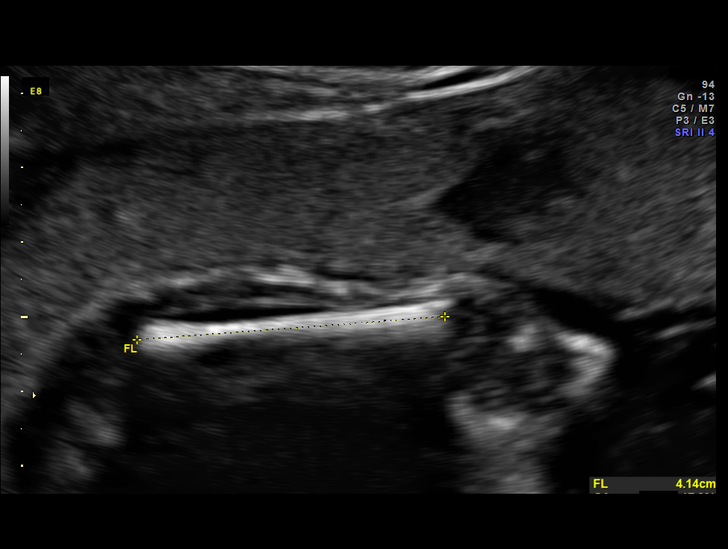
[im 31/49]
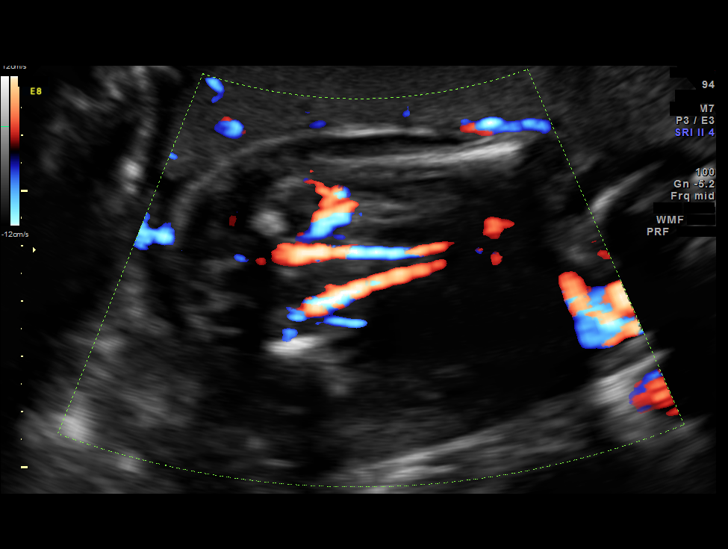
[im 34/49]
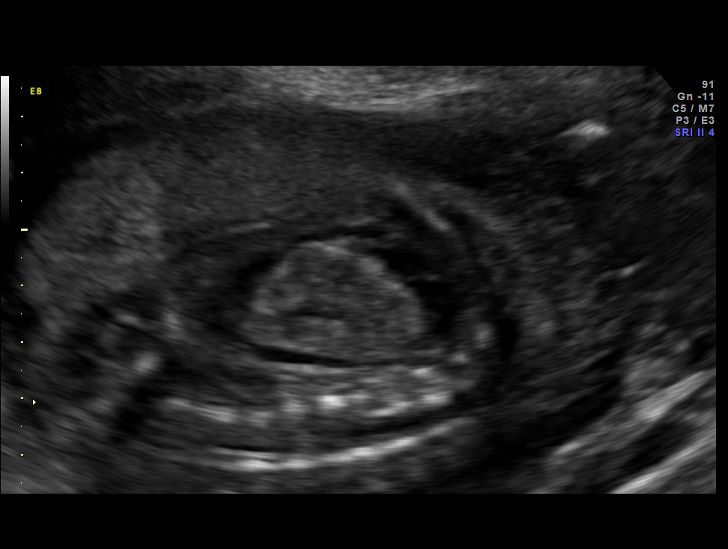
[im 40/49]
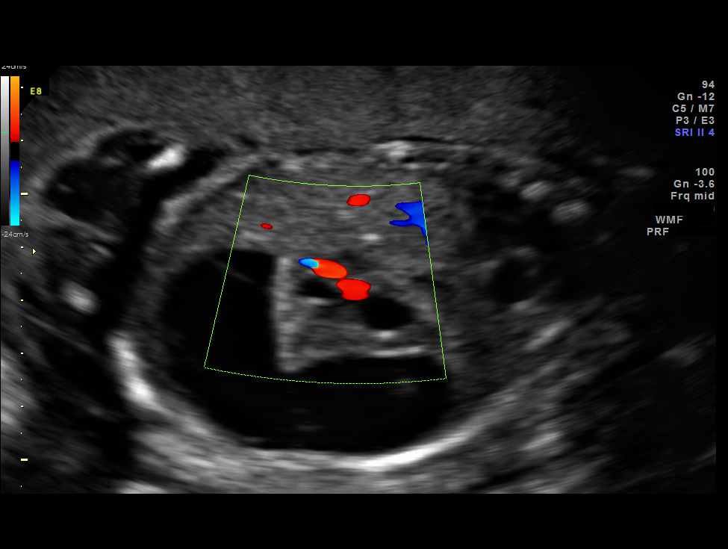
[im 43/49]
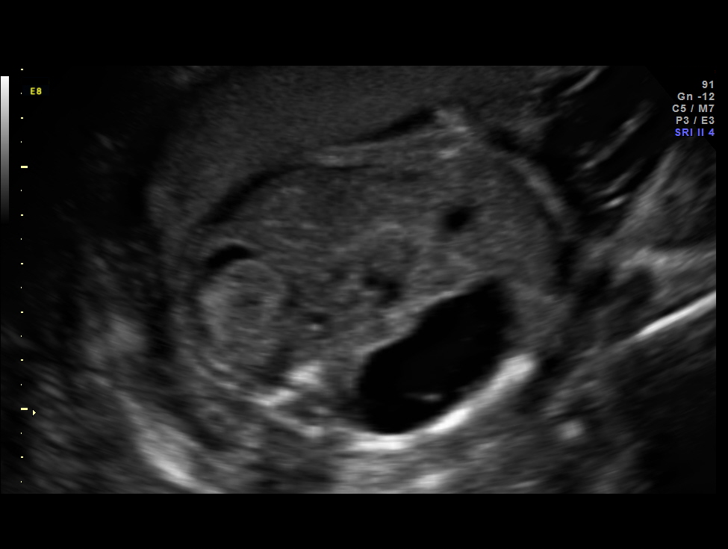
[im 47/49]
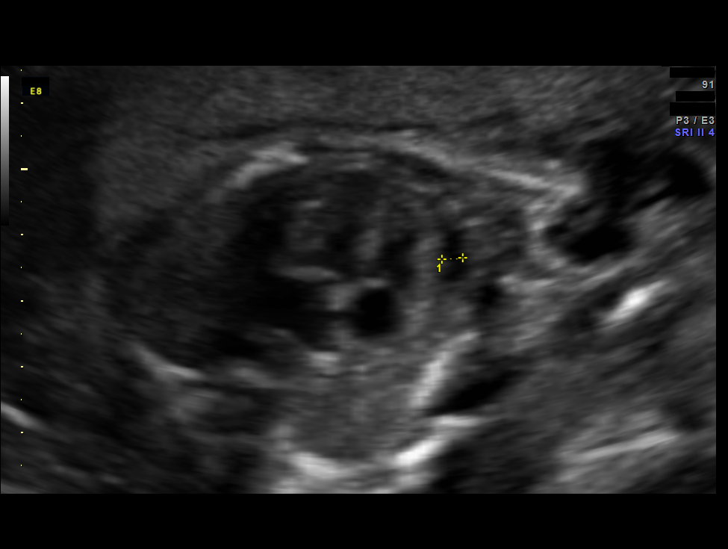

[12 of 28 positions shown; findings below may reference images not displayed]

OBSTETRICS REPORT
(Signed Final 07/23/2015 [DATE])

Name:       JHINO AMADO                  Visit  07/23/2015 [DATE]
Date:

Service(s) Provided

Indications

24 weeks gestation of pregnancy
Fetal abnormality - other known or suspected
(bilateral hydronephrosis, hydroureters) - low risk
NIPS
Fetal Evaluation

Num Of             1
Fetuses:
Fetal Heart        150                          bpm
Rate:
Cardiac Activity:  Observed
Presentation:      Cephalic
Placenta:          Anterior, above cervical
os
P. Cord            Previously Visualized
Insertion:

Amniotic Fluid
AFI FV:      Anhydramnios
Biometry

BPD:       55   m    G. Age:   22w 6d                 CI:         70.6   70 - 86
m
OFD:     77.9   m                                     FL/HC:      19.3   18.7 -
m
HC:     215.5   m    G. Age:   23w 4d        16  %    HC/AC:      1.09   1.05 -
m
AC:     198.5   m    G. Age:   24w 4d        53  %    FL/BPD      75.6   71 - 87
m                                     :
FL:      41.6   m    G. Age:   23w 4d        20  %    FL/AC:      21.0   20 - 24
m
HUM:     38.6   m    G. Age:   23w 5d        31  %
m
CER:     25.6   m    G. Age:   23w 4d        36  %
m
Est.         647   gm    1 lb 7 oz      48   %
FW:
Gestational Age

LMP:           29w 6d        Date:  12/26/14                  EDD:   10/02/15
U/S Today:     23w 4d                                         EDD:   11/15/15
Best:          24w 1d    Det. By:   Early Ultrasound          EDD:   11/11/15
(04/30/15)
Anatomy

Cranium:          Appears normal         Aortic Arch:       Previously seen
Fetal Cavum:      Appears normal         Ductal Arch:       Previously seen
Ventricles:       Appears normal         Diaphragm:         Appears normal
Choroid Plexus:   Previously seen        Stomach:           Present but small
Cerebellum:       Appears normal         Abdomen:           Variant, see
comments
Posterior         Appears normal         Abdominal          Previously seen
Fossa:                                   Wall:
Nuchal Fold:      Not applicable (>20    Cord Vessels:      Previously seen
wks GA)
Face:             Orbits nl; profile     Kidneys:           Echogenic
not well visualized                       kidneys, R
hydroureter
Lips:             Not well visualized    Bladder:           Absent fluid filled
bladder
Heart:            Pericardial            Spine:             Previously seen
effusion, NL 4CH
RVOT:             Previously seen        Lower              Previously seen
Extremities:
LVOT:             Previously seen        Upper              Previously seen
Extremities:

Other:   Male gender previously seen. Nasal bone visualized. Technically
difficult due to fetal position and low fluid.
Targeted Anatomy

Fetal Central Nervous System
Cisterna
Magna:
Cervix Uterus Adnexa

Cervical Length:    4.5       cm

Cervix:       Normal appearance by transabdominal scan.

Left Ovary:    Within normal limits.
Right Ovary:   Within normal limits.

Adnexa:     No abnormality visualized.
Impression

Single IUP at 24w 1d
Follow up due to bilateral hydronephrosis - NIPS - low risk
for aneuploidy
Anhydramnios is noted
Both kidneys appear bright and echogenic.  A fluid filled
bladder is not visualized.
A rim of fluid is noted around the left kidney (? fetal urine
from ruptured ureter)
A large cystic structure is noted that appears to pushe the
right kidney anteriorly - likley megaureter
A small pericardial effusion is noted - likely physiologic
Fetal growth is appropriate - 48th %tile.

The findings and limitations of the study were discussed
with the patient.  With the new finding of anhydramnios and
bright / echogenic fetal kidneys, my suspicion is that one
or both kidneys are not  functioning (obstructive uropathy?)
.  We had a discussion regarding the risk of pulmonary
hypoplasia - the need for amniotic fluid for normal lung
development.  Feel that the prognosis for the fetus is
guarded.
Recommendations

Will give a course of Klever for fetal lung maturity
Fetal echo due to pericardial effusion
Repeat ultrasound in 2 weeks for reevaluation
